# Patient Record
Sex: Male | Born: 1975 | Race: White | Hispanic: No | Marital: Married | Smoking: Former smoker
Health system: Southern US, Community
[De-identification: ages and names within clinical notes are randomized; demographics above are authoritative.]

## PROBLEM LIST (undated history)

## (undated) DIAGNOSIS — K219 Gastro-esophageal reflux disease without esophagitis: Secondary | ICD-10-CM

## (undated) DIAGNOSIS — B1920 Unspecified viral hepatitis C without hepatic coma: Secondary | ICD-10-CM

## (undated) HISTORY — PX: OTHER SURGICAL HISTORY: SHX169

## (undated) HISTORY — PX: TONSILLECTOMY AND ADENOIDECTOMY: SUR1326

## (undated) HISTORY — DX: Unspecified viral hepatitis C without hepatic coma: B19.20

## (undated) HISTORY — PX: HIP SURGERY: SHX245

## (undated) HISTORY — PX: HERNIA REPAIR: SHX51

## (undated) HISTORY — DX: Gastro-esophageal reflux disease without esophagitis: K21.9

---

## 2010-03-30 ENCOUNTER — Ambulatory Visit: Payer: Self-pay | Admitting: Internal Medicine

## 2014-07-12 ENCOUNTER — Ambulatory Visit (INDEPENDENT_AMBULATORY_CARE_PROVIDER_SITE_OTHER): Payer: Self-pay | Admitting: Internal Medicine

## 2014-10-18 ENCOUNTER — Encounter (INDEPENDENT_AMBULATORY_CARE_PROVIDER_SITE_OTHER): Payer: Self-pay | Admitting: *Deleted

## 2014-10-18 ENCOUNTER — Encounter (INDEPENDENT_AMBULATORY_CARE_PROVIDER_SITE_OTHER): Payer: Self-pay | Admitting: Internal Medicine

## 2014-10-18 ENCOUNTER — Other Ambulatory Visit (INDEPENDENT_AMBULATORY_CARE_PROVIDER_SITE_OTHER): Payer: Self-pay | Admitting: *Deleted

## 2014-10-18 ENCOUNTER — Ambulatory Visit (INDEPENDENT_AMBULATORY_CARE_PROVIDER_SITE_OTHER): Payer: Managed Care, Other (non HMO) | Admitting: Internal Medicine

## 2014-10-18 VITALS — BP 112/78 | HR 64 | Temp 97.8°F | Ht 72.0 in | Wt 234.7 lb

## 2014-10-18 DIAGNOSIS — K219 Gastro-esophageal reflux disease without esophagitis: Secondary | ICD-10-CM | POA: Insufficient documentation

## 2014-10-18 DIAGNOSIS — B1921 Unspecified viral hepatitis C with hepatic coma: Secondary | ICD-10-CM

## 2014-10-18 DIAGNOSIS — B1920 Unspecified viral hepatitis C without hepatic coma: Secondary | ICD-10-CM | POA: Insufficient documentation

## 2014-10-18 LAB — HEPATITIS C ANTIBODY: HCV Ab: REACTIVE — AB

## 2014-10-18 LAB — PROTIME-INR
INR: 1.08 (ref <1.50)
Prothrombin Time: 14 seconds (ref 11.6–15.2)

## 2014-10-18 LAB — CBC WITH DIFFERENTIAL/PLATELET
Basophils Absolute: 0.1 10*3/uL (ref 0.0–0.1)
Basophils Relative: 1 % (ref 0–1)
Eosinophils Absolute: 0.2 10*3/uL (ref 0.0–0.7)
Eosinophils Relative: 3 % (ref 0–5)
HEMATOCRIT: 46.9 % (ref 39.0–52.0)
Hemoglobin: 16.2 g/dL (ref 13.0–17.0)
LYMPHS PCT: 32 % (ref 12–46)
Lymphs Abs: 2 10*3/uL (ref 0.7–4.0)
MCH: 30.6 pg (ref 26.0–34.0)
MCHC: 34.5 g/dL (ref 30.0–36.0)
MCV: 88.5 fL (ref 78.0–100.0)
MPV: 10.8 fL (ref 8.6–12.4)
Monocytes Absolute: 0.6 10*3/uL (ref 0.1–1.0)
Monocytes Relative: 9 % (ref 3–12)
NEUTROS ABS: 3.5 10*3/uL (ref 1.7–7.7)
Neutrophils Relative %: 55 % (ref 43–77)
Platelets: 233 10*3/uL (ref 150–400)
RBC: 5.3 MIL/uL (ref 4.22–5.81)
RDW: 12.4 % (ref 11.5–15.5)
WBC: 6.3 10*3/uL (ref 4.0–10.5)

## 2014-10-18 LAB — COMPREHENSIVE METABOLIC PANEL
ALK PHOS: 47 U/L (ref 39–117)
ALT: 85 U/L — AB (ref 0–53)
AST: 48 U/L — ABNORMAL HIGH (ref 0–37)
Albumin: 4 g/dL (ref 3.5–5.2)
BUN: 15 mg/dL (ref 6–23)
CHLORIDE: 102 meq/L (ref 96–112)
CO2: 27 mEq/L (ref 19–32)
Calcium: 9.7 mg/dL (ref 8.4–10.5)
Creat: 0.97 mg/dL (ref 0.50–1.35)
GLUCOSE: 98 mg/dL (ref 70–99)
POTASSIUM: 4.7 meq/L (ref 3.5–5.3)
SODIUM: 137 meq/L (ref 135–145)
TOTAL PROTEIN: 7.2 g/dL (ref 6.0–8.3)
Total Bilirubin: 0.6 mg/dL (ref 0.2–1.2)

## 2014-10-18 LAB — TSH: TSH: 2.417 u[IU]/mL (ref 0.350–4.500)

## 2014-10-18 MED ORDER — PANTOPRAZOLE SODIUM 40 MG PO TBEC: 40.0000 mg | DELAYED_RELEASE_TABLET | Freq: Two times a day (BID) | ORAL | Status: DC
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
## 2014-10-18 NOTE — Patient Instructions (Signed)
EGD. The risks and benefits such as perforation, bleeding, and infection were reviewed with the patient and is agreeable. 

## 2014-10-19 LAB — DRUG SCREEN, URINE
Amphetamine Screen, Ur: NEGATIVE
Barbiturate Quant, Ur: NEGATIVE
Benzodiazepines.: NEGATIVE
COCAINE METABOLITES: NEGATIVE
CREATININE, U: 66.41 mg/dL
MARIJUANA METABOLITE: NEGATIVE
Methadone: NEGATIVE
OPIATES: NEGATIVE
Phencyclidine (PCP): NEGATIVE
Propoxyphene: NEGATIVE

## 2014-10-19 LAB — AFP TUMOR MARKER: AFP-Tumor Marker: 2.8 ng/mL (ref <6.1)

## 2014-10-21 LAB — HEPATITIS C RNA QUANTITATIVE
HCV QUANT LOG: 6.16 {Log} — AB (ref <1.18)
HCV QUANT: 1447898 [IU]/mL — AB (ref <15)

## 2014-10-22 ENCOUNTER — Telehealth (INDEPENDENT_AMBULATORY_CARE_PROVIDER_SITE_OTHER): Payer: Self-pay | Admitting: Internal Medicine

## 2014-10-22 DIAGNOSIS — B1921 Unspecified viral hepatitis C with hepatic coma: Secondary | ICD-10-CM

## 2014-10-22 DIAGNOSIS — B1920 Unspecified viral hepatitis C without hepatic coma: Secondary | ICD-10-CM

## 2014-10-22 LAB — HEPATITIS C GENOTYPE: HCV GENOTYPE: 3

## 2014-10-22 MED ORDER — PANTOPRAZOLE SODIUM 40 MG PO TBEC: 40.0000 mg | DELAYED_RELEASE_TABLET | Freq: Two times a day (BID) | ORAL | Status: DC

## 2014-10-22 NOTE — Telephone Encounter (Signed)
US abdomen/elastrography ordered

## 2014-10-22 NOTE — Telephone Encounter (Signed)
Spoke with patient.

## 2014-10-22 NOTE — Telephone Encounter (Signed)
error 

## 2014-10-30 ENCOUNTER — Ambulatory Visit (HOSPITAL_COMMUNITY)
Admission: RE | Admit: 2014-10-30 | Discharge: 2014-10-30 | Disposition: A | Discharge status: Home / Self Care | Payer: 59 | Source: Ambulatory Visit | Attending: Internal Medicine | Admitting: Internal Medicine

## 2014-10-30 DIAGNOSIS — B1920 Unspecified viral hepatitis C without hepatic coma: Secondary | ICD-10-CM | POA: Insufficient documentation

## 2014-10-31 ENCOUNTER — Telehealth (INDEPENDENT_AMBULATORY_CARE_PROVIDER_SITE_OTHER): Payer: Self-pay | Admitting: Internal Medicine

## 2014-10-31 NOTE — Telephone Encounter (Signed)
Tammy, will treat with Sovaldi and Ribavirin 1200mg  x 24 weeks

## 2014-11-01 NOTE — Telephone Encounter (Signed)
A request will be sent to [facility].

## 2014-11-13 ENCOUNTER — Encounter (HOSPITAL_COMMUNITY)
Admission: RE | Disposition: A | Discharge status: Home / Self Care | Payer: Self-pay | Source: Ambulatory Visit | Attending: Internal Medicine

## 2014-11-13 ENCOUNTER — Ambulatory Visit (HOSPITAL_COMMUNITY)
Admission: RE | Admit: 2014-11-13 | Discharge: 2014-11-13 | Disposition: A | Discharge status: Home / Self Care | Payer: Managed Care, Other (non HMO) | Source: Ambulatory Visit | Attending: Internal Medicine | Admitting: Internal Medicine

## 2014-11-13 ENCOUNTER — Encounter (HOSPITAL_COMMUNITY): Payer: Self-pay | Admitting: *Deleted

## 2014-11-13 DIAGNOSIS — K3189 Other diseases of stomach and duodenum: Secondary | ICD-10-CM | POA: Diagnosis not present

## 2014-11-13 DIAGNOSIS — R1319 Other dysphagia: Secondary | ICD-10-CM | POA: Diagnosis present

## 2014-11-13 DIAGNOSIS — K2270 Barrett's esophagus without dysplasia: Secondary | ICD-10-CM | POA: Diagnosis not present

## 2014-11-13 DIAGNOSIS — Z87891 Personal history of nicotine dependence: Secondary | ICD-10-CM | POA: Insufficient documentation

## 2014-11-13 DIAGNOSIS — Z800 Family history of malignant neoplasm of digestive organs: Secondary | ICD-10-CM | POA: Diagnosis not present

## 2014-11-13 DIAGNOSIS — R1310 Dysphagia, unspecified: Secondary | ICD-10-CM | POA: Diagnosis not present

## 2014-11-13 DIAGNOSIS — B1920 Unspecified viral hepatitis C without hepatic coma: Secondary | ICD-10-CM | POA: Diagnosis not present

## 2014-11-13 DIAGNOSIS — K449 Diaphragmatic hernia without obstruction or gangrene: Secondary | ICD-10-CM

## 2014-11-13 DIAGNOSIS — K222 Esophageal obstruction: Secondary | ICD-10-CM | POA: Insufficient documentation

## 2014-11-13 DIAGNOSIS — K2211 Ulcer of esophagus with bleeding: Secondary | ICD-10-CM | POA: Diagnosis not present

## 2014-11-13 DIAGNOSIS — Z79899 Other long term (current) drug therapy: Secondary | ICD-10-CM | POA: Insufficient documentation

## 2014-11-13 DIAGNOSIS — K219 Gastro-esophageal reflux disease without esophagitis: Secondary | ICD-10-CM

## 2014-11-13 HISTORY — PX: ESOPHAGOGASTRODUODENOSCOPY: SHX5428

## 2014-11-13 SURGERY — EGD (ESOPHAGOGASTRODUODENOSCOPY)
Anesthesia: Moderate Sedation

## 2014-11-13 MED ORDER — STERILE WATER FOR IRRIGATION IR SOLN
Status: DC | PRN
  Administered 2014-11-13: 12:00:00

## 2014-11-13 MED ORDER — MIDAZOLAM HCL 5 MG/5ML IJ SOLN
INTRAMUSCULAR | Status: AC
  Filled 2014-11-13: qty 10

## 2014-11-13 MED ORDER — MEPERIDINE HCL 50 MG/ML IJ SOLN
INTRAMUSCULAR | Status: DC | PRN
  Administered 2014-11-13 (×2): 25 mg

## 2014-11-13 MED ORDER — MEPERIDINE HCL 50 MG/ML IJ SOLN
INTRAMUSCULAR | Status: AC
  Filled 2014-11-13: qty 1

## 2014-11-13 MED ORDER — MIDAZOLAM HCL 5 MG/5ML IJ SOLN
INTRAMUSCULAR | Status: AC
  Filled 2014-11-13: qty 5

## 2014-11-13 MED ORDER — BUTAMBEN-TETRACAINE-BENZOCAINE 2-2-14 % EX AERO
INHALATION_SPRAY | CUTANEOUS | Status: DC | PRN
  Administered 2014-11-13: 2 via TOPICAL

## 2014-11-13 MED ORDER — SODIUM CHLORIDE 0.9 % IV SOLN
INTRAVENOUS | Status: DC
  Administered 2014-11-13: 12:00:00 via INTRAVENOUS

## 2014-11-13 MED ORDER — MIDAZOLAM HCL 5 MG/5ML IJ SOLN
INTRAMUSCULAR | Status: DC | PRN
  Administered 2014-11-13: 3 mg via INTRAVENOUS
  Administered 2014-11-13: 2 mg via INTRAVENOUS
  Administered 2014-11-13: 1 mg via INTRAVENOUS
  Administered 2014-11-13 (×3): 2 mg via INTRAVENOUS
  Administered 2014-11-13: 3 mg via INTRAVENOUS

## 2014-11-13 NOTE — Op Note (Signed)
EGD PROCEDURE REPORT  PATIENT:  David LoganRonald D Anthony  MR#:  [id number] Birthdate:  [birth year], 39 y.o., male Endoscopist:  Dr. Malissa HippoNajeeb U. Arielle Eber, MD  Procedure Date: 11/13/2014  Procedure:   EGD with ED  Indications:  Patient is 39 year old Caucasian male with 20 of history of heartburn who is presently in pantoprazole 40 mg twice a day and doing well. She also complains of intermittent solid food dysphagia. He is undergoing diagnostic/therapeutic EGD.            Informed Consent:  The risks, benefits, alternatives & imponderables which include, but are not limited to, bleeding, infection, perforation, drug reaction and potential missed lesion have been reviewed.  The potential for biopsy, lesion removal, esophageal dilation, etc. have also been discussed.  Questions have been answered.  All parties agreeable.  Please see history & physical in medical record for more information.  Medications:  Demerol 50 mg IV Versed 15 mg IV Cetacaine spray topically for oropharyngeal anesthesia  Description of procedure:  The endoscope was introduced through the mouth and advanced to the second portion of the duodenum without difficulty or limitations. The mucosal surfaces were surveyed very carefully during advancement of the scope and upon withdrawal.  Findings:  Esophagus:  Mucosa of the esophagus was normal. Serrated or baby GE junction with noncritical ring. 4 mm island of salmon colored mucosa was noted above the GE junction. GEJ:  39 cm Hiatus:  39 cm Stomach:  Stomach was empty and distended very well with insufflation. Folds in the proximal stomach were normal. Examination mucosa and body, antrum, pyloric channel, and restless fundus and cardia was normal. Duodenum:  Normal bulbar and post bulbar mucosa.  Therapeutic/Diagnostic Maneuvers Performed:   Esophagus dilated by passing 56 [place] dilator. Dilator was only passed to 65 cm. Ring was still intact and therefore disrupted with focal biopsy at  two sites. Biopsy was also taken from mucosa to GE junction to rule out short segment Barrett's esophagus.  Complications:  None  Impression: Wavy or serrated GE junction with small sliding hiatal hernia. Noncritical Schatzki's ring disrupted with focal biopsy as it was still intact following passage of 56 [place] dilator. Biopsy taken from GE junction to rule out short segment Barrett's.   Recommendations:  Continue anti-reflux measures. Continue pantoprazole at 40 mg by mouth twice a day. I will be contacting patient with biopsy results.  Ambry Dix U  11/13/2014  12:48 PM  CC: Dr. Bonnetta BarryNo PCP Per Patient & Dr. No ref. provider found
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
## 2014-11-13 NOTE — Discharge Instructions (Signed)
Resume pantoprazole as before. Anti-reflux measures. No driving for 24 hours. Physician will call with biopsy results.     Esophagogastroduodenoscopy Care After Refer to this sheet in the next few weeks. These instructions provide you with information on caring for yourself after your procedure. Your caregiver may also give you more specific instructions. Your treatment has been planned according to current medical practices, but problems sometimes occur. Call your caregiver if you have any problems or questions after your procedure.  HOME CARE INSTRUCTIONS  Do not eat or drink anything until the numbing medicine (local anesthetic) has worn off and your gag reflex has returned. You will know that the local anesthetic has worn off when you can swallow comfortably.  Do not drive for 12 hours after the procedure or as directed by your caregiver.  Only take medicines as directed by your caregiver. SEEK MEDICAL CARE IF:   You cannot stop coughing.  You are not urinating at all or less than usual. SEEK IMMEDIATE MEDICAL CARE IF:  You have difficulty swallowing.  You cannot eat or drink.  You have worsening throat or chest pain.  You have dizziness, lightheadedness, or you faint.  You have nausea or vomiting.  You have chills.  You have a fever.  You have severe abdominal pain.  You have black, tarry, or bloody stools. Document Released: 08/09/2012 Document Reviewed: 08/09/2012 [facility] Patient Information 2015 [place], [place]. This information is not intended to replace advice given to you by your health care provider. Make sure you discuss any questions you have with your health care provider.  Gastroesophageal Reflux Disease, Adult Gastroesophageal reflux disease (GERD) happens when acid from your stomach flows up into the esophagus. When acid comes in contact with the esophagus, the acid causes soreness (inflammation) in the esophagus. Over time, GERD may create small holes  (ulcers) in the lining of the esophagus. CAUSES   Increased body weight. This puts pressure on the stomach, making acid rise from the stomach into the esophagus.  Smoking. This increases acid production in the stomach.  Drinking alcohol. This causes decreased pressure in the lower esophageal sphincter (valve or ring of muscle between the esophagus and stomach), allowing acid from the stomach into the esophagus.  Late evening meals and a full stomach. This increases pressure and acid production in the stomach.  A malformed lower esophageal sphincter. Sometimes, no cause is found. SYMPTOMS   Burning pain in the lower part of the mid-chest behind the breastbone and in the mid-stomach area. This may occur twice a week or more often.  Trouble swallowing.  Sore throat.  Dry cough.  Asthma-like symptoms including chest tightness, shortness of breath, or wheezing. DIAGNOSIS  Your caregiver may be able to diagnose GERD based on your symptoms. In some cases, X-rays and other tests may be done to check for complications or to check the condition of your stomach and esophagus. TREATMENT  Your caregiver may recommend over-the-counter or prescription medicines to help decrease acid production. Ask your caregiver before starting or adding any new medicines.  HOME CARE INSTRUCTIONS   Change the factors that you can control. Ask your caregiver for guidance concerning weight loss, quitting smoking, and alcohol consumption.  Avoid foods and drinks that make your symptoms worse, such as:  Caffeine or alcoholic drinks.  Chocolate.  Peppermint or mint flavorings.  Garlic and onions.  Spicy foods.  Citrus fruits, such as oranges, lemons, or limes.  Tomato-based foods such as sauce, chili, salsa, and pizza.  [place]  and fatty foods.  Avoid lying down for the 3 hours prior to your bedtime or prior to taking a nap.  Eat small, frequent meals instead of large meals.  Wear loose-fitting  clothing. Do not wear anything tight around your waist that causes pressure on your stomach.  Raise the head of your bed 6 to 8 inches with wood blocks to help you sleep. Extra pillows will not help.  Only take over-the-counter or prescription medicines for pain, discomfort, or fever as directed by your caregiver.  Do not take aspirin, ibuprofen, or other nonsteroidal anti-inflammatory drugs (NSAIDs). SEEK IMMEDIATE MEDICAL CARE IF:   You have pain in your arms, neck, jaw, teeth, or back.  Your pain increases or changes in intensity or duration.  You develop nausea, vomiting, or sweating (diaphoresis).  You develop shortness of breath, or you faint.  Your vomit is green, yellow, black, or looks like coffee grounds or blood.  Your stool is red, bloody, or black. These symptoms could be signs of other problems, such as heart disease, gastric bleeding, or esophageal bleeding. MAKE SURE YOU:   Understand these instructions.  Will watch your condition.  Will get help right away if you are not doing well or get worse. Document Released: 06/02/2005 Document Revised: 11/15/2011 Document Reviewed: 03/12/2011 [facility] Patient Information 2015 [place], [place]. This information is not intended to replace advice given to you by your health care provider. Make sure you discuss any questions you have with your health care provider.

## 2014-11-15 ENCOUNTER — Encounter (HOSPITAL_COMMUNITY): Payer: Self-pay | Admitting: Internal Medicine

## 2014-11-19 ENCOUNTER — Encounter (INDEPENDENT_AMBULATORY_CARE_PROVIDER_SITE_OTHER): Payer: Self-pay | Admitting: Internal Medicine

## 2015-01-02 ENCOUNTER — Telehealth (INDEPENDENT_AMBULATORY_CARE_PROVIDER_SITE_OTHER): Payer: Self-pay | Admitting: *Deleted

## 2015-01-02 NOTE — Telephone Encounter (Signed)
I talked with the patient had he states that he is to ger his medication tomorrow,01/03/15 or Saturday ,01/04/15. He is going to call our office to make us aware of his start date , so that we may arrange his labs and office visits.

## 2015-01-06 ENCOUNTER — Encounter (INDEPENDENT_AMBULATORY_CARE_PROVIDER_SITE_OTHER): Payer: Self-pay | Admitting: *Deleted

## 2015-01-06 ENCOUNTER — Telehealth (INDEPENDENT_AMBULATORY_CARE_PROVIDER_SITE_OTHER): Payer: Self-pay | Admitting: *Deleted

## 2015-01-06 DIAGNOSIS — B182 Chronic viral hepatitis C: Secondary | ICD-10-CM

## 2015-01-06 NOTE — Telephone Encounter (Signed)
Labs have been noted and a letter has been sent to patient as a reminder.

## 2015-01-06 NOTE — Telephone Encounter (Signed)
David Anthony started his Hep C treatment on 01/03/15. A 4 week f/u has been scheduled on 01/31/15 with David Arerri Setzer, NP. He was also advised to have labs the day before his apt.

## 2015-01-06 NOTE — Telephone Encounter (Signed)
Needs Hepatic function, CBC with diff in 2 weeks.

## 2015-01-06 NOTE — Telephone Encounter (Signed)
Labs have been noted and a letter has been sent to patient as a reminder. 

## 2015-01-06 NOTE — Telephone Encounter (Signed)
Labs are noted for 2 week sand 4 weeks.

## 2015-01-20 LAB — CBC WITH DIFFERENTIAL/PLATELET
Basophils Absolute: 0 10*3/uL (ref 0.0–0.1)
Basophils Relative: 0 % (ref 0–1)
EOS ABS: 0.2 10*3/uL (ref 0.0–0.7)
Eosinophils Relative: 3 % (ref 0–5)
HCT: 39.4 % (ref 39.0–52.0)
Hemoglobin: 13.6 g/dL (ref 13.0–17.0)
LYMPHS PCT: 28 % (ref 12–46)
Lymphs Abs: 2.2 10*3/uL (ref 0.7–4.0)
MCH: 30.6 pg (ref 26.0–34.0)
MCHC: 34.5 g/dL (ref 30.0–36.0)
MCV: 88.5 fL (ref 78.0–100.0)
MONOS PCT: 8 % (ref 3–12)
MPV: 10.3 fL (ref 8.6–12.4)
Monocytes Absolute: 0.6 10*3/uL (ref 0.1–1.0)
NEUTROS ABS: 4.7 10*3/uL (ref 1.7–7.7)
NEUTROS PCT: 61 % (ref 43–77)
Platelets: 279 10*3/uL (ref 150–400)
RBC: 4.45 MIL/uL (ref 4.22–5.81)
RDW: 12.7 % (ref 11.5–15.5)
WBC: 7.7 10*3/uL (ref 4.0–10.5)

## 2015-01-20 LAB — HEPATIC FUNCTION PANEL
ALK PHOS: 47 U/L (ref 39–117)
ALT: 19 U/L (ref 0–53)
AST: 19 U/L (ref 0–37)
Albumin: 3.9 g/dL (ref 3.5–5.2)
Bilirubin, Direct: 0.4 mg/dL — ABNORMAL HIGH (ref 0.0–0.3)
Indirect Bilirubin: 1 mg/dL (ref 0.2–1.2)
TOTAL PROTEIN: 6.8 g/dL (ref 6.0–8.3)
Total Bilirubin: 1.4 mg/dL — ABNORMAL HIGH (ref 0.2–1.2)

## 2015-01-21 LAB — HEPATITIS C RNA QUANTITATIVE: HCV Quantitative Log: <1.18 {Log} (ref <1.18)

## 2015-01-22 ENCOUNTER — Telehealth (INDEPENDENT_AMBULATORY_CARE_PROVIDER_SITE_OTHER): Payer: Self-pay | Admitting: *Deleted

## 2015-01-22 NOTE — Telephone Encounter (Signed)
David Anthony is returning [name]'s call. His phone number is [phone].

## 2015-01-22 NOTE — Telephone Encounter (Signed)
Results given to patient

## 2015-01-24 ENCOUNTER — Telehealth (INDEPENDENT_AMBULATORY_CARE_PROVIDER_SITE_OTHER): Payer: Self-pay | Admitting: Internal Medicine

## 2015-01-24 DIAGNOSIS — J328 Other chronic sinusitis: Secondary | ICD-10-CM

## 2015-01-24 MED ORDER — LEVOFLOXACIN 250 MG PO TABS: 250.0000 mg | ORAL_TABLET | Freq: Two times a day (BID) | ORAL | Status: DC

## 2015-01-24 NOTE — Telephone Encounter (Signed)
Rx for Levaquin 250mg  BID sent to pharmacy

## 2015-01-31 ENCOUNTER — Ambulatory Visit (INDEPENDENT_AMBULATORY_CARE_PROVIDER_SITE_OTHER): Payer: Managed Care, Other (non HMO) | Admitting: Internal Medicine

## 2015-01-31 ENCOUNTER — Telehealth (INDEPENDENT_AMBULATORY_CARE_PROVIDER_SITE_OTHER): Payer: Self-pay | Admitting: *Deleted

## 2015-01-31 ENCOUNTER — Encounter (INDEPENDENT_AMBULATORY_CARE_PROVIDER_SITE_OTHER): Payer: Self-pay | Admitting: Internal Medicine

## 2015-01-31 ENCOUNTER — Encounter (INDEPENDENT_AMBULATORY_CARE_PROVIDER_SITE_OTHER): Payer: Self-pay | Admitting: *Deleted

## 2015-01-31 VITALS — BP 100/70 | HR 80 | Temp 97.0°F | Ht 72.0 in | Wt 227.4 lb

## 2015-01-31 DIAGNOSIS — B1921 Unspecified viral hepatitis C with hepatic coma: Secondary | ICD-10-CM | POA: Diagnosis not present

## 2015-01-31 DIAGNOSIS — B182 Chronic viral hepatitis C: Secondary | ICD-10-CM

## 2015-01-31 NOTE — Patient Instructions (Addendum)
OV in 8 weeks . Hep C quaint, CBC with diff , Hepatic function in 4 weeks.

## 2015-01-31 NOTE — Telephone Encounter (Signed)
.  Per [name] Setzer,NP patient to have lab work in 4 weeks. 
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
## 2015-02-01 LAB — HEPATIC FUNCTION PANEL
ALK PHOS: 43 U/L (ref 39–117)
ALT: 12 U/L (ref 0–53)
AST: 15 U/L (ref 0–37)
Albumin: 3.9 g/dL (ref 3.5–5.2)
BILIRUBIN INDIRECT: 1.4 mg/dL — AB (ref 0.2–1.2)
BILIRUBIN TOTAL: 1.8 mg/dL — AB (ref 0.2–1.2)
Bilirubin, Direct: 0.4 mg/dL — ABNORMAL HIGH (ref 0.0–0.3)
Total Protein: 6.5 g/dL (ref 6.0–8.3)

## 2015-02-05 LAB — HEPATITIS C RNA QUANTITATIVE: HCV Quantitative: NOT DETECTED IU/mL (ref <15)

## 2015-03-01 LAB — CBC WITH DIFFERENTIAL/PLATELET
BASOS ABS: 0.1 10*3/uL (ref 0.0–0.1)
Basophils Relative: 1 % (ref 0–1)
EOS PCT: 3 % (ref 0–5)
Eosinophils Absolute: 0.2 10*3/uL (ref 0.0–0.7)
HCT: 41.4 % (ref 39.0–52.0)
HEMOGLOBIN: 13.3 g/dL (ref 13.0–17.0)
LYMPHS ABS: 2.1 10*3/uL (ref 0.7–4.0)
Lymphocytes Relative: 27 % (ref 12–46)
MCH: 29.1 pg (ref 26.0–34.0)
MCHC: 32.1 g/dL (ref 30.0–36.0)
MCV: 90.6 fL (ref 78.0–100.0)
MONO ABS: 0.4 10*3/uL (ref 0.1–1.0)
MONOS PCT: 5 % (ref 3–12)
MPV: 10.5 fL (ref 8.6–12.4)
Neutro Abs: 4.9 10*3/uL (ref 1.7–7.7)
Neutrophils Relative %: 64 % (ref 43–77)
Platelets: 268 10*3/uL (ref 150–400)
RBC: 4.57 MIL/uL (ref 4.22–5.81)
RDW: 12.8 % (ref 11.5–15.5)
WBC: 7.6 10*3/uL (ref 4.0–10.5)

## 2015-03-01 LAB — HEPATIC FUNCTION PANEL
ALK PHOS: 53 U/L (ref 39–117)
ALT: 10 U/L (ref 0–53)
AST: 11 U/L (ref 0–37)
Albumin: 4 g/dL (ref 3.5–5.2)
Bilirubin, Direct: 0.2 mg/dL (ref 0.0–0.3)
Indirect Bilirubin: 0.7 mg/dL (ref 0.2–1.2)
Total Bilirubin: 0.9 mg/dL (ref 0.2–1.2)
Total Protein: 6.8 g/dL (ref 6.0–8.3)

## 2015-03-04 LAB — HEPATITIS C RNA QUANTITATIVE: HCV QUANT: NOT DETECTED [IU]/mL (ref <15)

## 2015-03-24 ENCOUNTER — Other Ambulatory Visit (INDEPENDENT_AMBULATORY_CARE_PROVIDER_SITE_OTHER): Payer: Self-pay | Admitting: Internal Medicine

## 2015-03-26 ENCOUNTER — Ambulatory Visit (INDEPENDENT_AMBULATORY_CARE_PROVIDER_SITE_OTHER): Payer: Managed Care, Other (non HMO) | Admitting: Internal Medicine

## 2015-03-26 ENCOUNTER — Encounter (INDEPENDENT_AMBULATORY_CARE_PROVIDER_SITE_OTHER): Payer: Self-pay | Admitting: Internal Medicine

## 2015-03-26 VITALS — BP 126/70 | HR 64 | Temp 97.5°F | Ht 72.0 in | Wt 230.4 lb

## 2015-03-26 DIAGNOSIS — B1920 Unspecified viral hepatitis C without hepatic coma: Secondary | ICD-10-CM | POA: Diagnosis not present
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
## 2015-03-26 NOTE — Patient Instructions (Addendum)
OV in 6 weeks. Labs in 4 weeks and today.

## 2015-03-27 ENCOUNTER — Telehealth (INDEPENDENT_AMBULATORY_CARE_PROVIDER_SITE_OTHER): Payer: Self-pay | Admitting: *Deleted

## 2015-03-27 DIAGNOSIS — B182 Chronic viral hepatitis C: Secondary | ICD-10-CM

## 2015-03-27 LAB — HEPATIC FUNCTION PANEL
ALK PHOS: 49 U/L (ref 39–117)
ALT: 12 U/L (ref 0–53)
AST: 15 U/L (ref 0–37)
Albumin: 4.1 g/dL (ref 3.5–5.2)
BILIRUBIN DIRECT: 0.2 mg/dL (ref 0.0–0.3)
BILIRUBIN TOTAL: 1 mg/dL (ref 0.2–1.2)
Indirect Bilirubin: 0.8 mg/dL (ref 0.2–1.2)
TOTAL PROTEIN: 6.8 g/dL (ref 6.0–8.3)

## 2015-03-27 LAB — CBC WITH DIFFERENTIAL/PLATELET
BASOS ABS: 0.1 10*3/uL (ref 0.0–0.1)
Basophils Relative: 1 % (ref 0–1)
EOS PCT: 2 % (ref 0–5)
Eosinophils Absolute: 0.1 10*3/uL (ref 0.0–0.7)
HCT: 39.1 % (ref 39.0–52.0)
Hemoglobin: 12.4 g/dL — ABNORMAL LOW (ref 13.0–17.0)
LYMPHS ABS: 1.6 10*3/uL (ref 0.7–4.0)
LYMPHS PCT: 25 % (ref 12–46)
MCH: 28.7 pg (ref 26.0–34.0)
MCHC: 31.7 g/dL (ref 30.0–36.0)
MCV: 90.5 fL (ref 78.0–100.0)
MONO ABS: 0.5 10*3/uL (ref 0.1–1.0)
MONOS PCT: 7 % (ref 3–12)
MPV: 10.3 fL (ref 8.6–12.4)
NEUTROS ABS: 4.2 10*3/uL (ref 1.7–7.7)
NEUTROS PCT: 65 % (ref 43–77)
Platelets: 272 10*3/uL (ref 150–400)
RBC: 4.32 MIL/uL (ref 4.22–5.81)
RDW: 12.8 % (ref 11.5–15.5)
WBC: 6.5 10*3/uL (ref 4.0–10.5)

## 2015-03-27 NOTE — Telephone Encounter (Signed)
.  Per [name] Setzer,NP patient to have lab work in 4 weeks. 

## 2015-03-28 ENCOUNTER — Ambulatory Visit (INDEPENDENT_AMBULATORY_CARE_PROVIDER_SITE_OTHER): Payer: Managed Care, Other (non HMO) | Admitting: Internal Medicine

## 2015-03-28 LAB — HEPATITIS C RNA QUANTITATIVE: HCV Quantitative: NOT DETECTED IU/mL (ref <15)

## 2015-04-02 ENCOUNTER — Other Ambulatory Visit (INDEPENDENT_AMBULATORY_CARE_PROVIDER_SITE_OTHER): Payer: Self-pay | Admitting: *Deleted

## 2015-04-02 ENCOUNTER — Encounter (INDEPENDENT_AMBULATORY_CARE_PROVIDER_SITE_OTHER): Payer: Self-pay | Admitting: *Deleted

## 2015-04-02 DIAGNOSIS — B182 Chronic viral hepatitis C: Secondary | ICD-10-CM

## 2015-05-06 LAB — HEPATIC FUNCTION PANEL
ALBUMIN: 4.2 g/dL (ref 3.6–5.1)
ALT: 12 U/L (ref 9–46)
AST: 15 U/L (ref 10–40)
Alkaline Phosphatase: 46 U/L (ref 40–115)
BILIRUBIN TOTAL: 1.2 mg/dL (ref 0.2–1.2)
Bilirubin, Direct: 0.3 mg/dL — ABNORMAL HIGH (ref <=0.2)
Indirect Bilirubin: 0.9 mg/dL (ref 0.2–1.2)
Total Protein: 6.6 g/dL (ref 6.1–8.1)

## 2015-05-06 LAB — CBC WITH DIFFERENTIAL/PLATELET
BASOS PCT: 1 % (ref 0–1)
Basophils Absolute: 0.1 10*3/uL (ref 0.0–0.1)
EOS ABS: 0.2 10*3/uL (ref 0.0–0.7)
Eosinophils Relative: 3 % (ref 0–5)
HCT: 38.6 % — ABNORMAL LOW (ref 39.0–52.0)
HEMOGLOBIN: 12.7 g/dL — AB (ref 13.0–17.0)
LYMPHS ABS: 1.3 10*3/uL (ref 0.7–4.0)
Lymphocytes Relative: 21 % (ref 12–46)
MCH: 29.3 pg (ref 26.0–34.0)
MCHC: 32.9 g/dL (ref 30.0–36.0)
MCV: 88.9 fL (ref 78.0–100.0)
MONO ABS: 0.5 10*3/uL (ref 0.1–1.0)
MPV: 9.9 fL (ref 8.6–12.4)
Monocytes Relative: 8 % (ref 3–12)
NEUTROS PCT: 67 % (ref 43–77)
Neutro Abs: 4.1 10*3/uL (ref 1.7–7.7)
Platelets: 246 10*3/uL (ref 150–400)
RBC: 4.34 MIL/uL (ref 4.22–5.81)
RDW: 12.7 % (ref 11.5–15.5)
WBC: 6.1 10*3/uL (ref 4.0–10.5)

## 2015-05-06 LAB — HEPATITIS C RNA QUANTITATIVE: HCV QUANT: NOT DETECTED [IU]/mL (ref <15)

## 2015-05-08 ENCOUNTER — Ambulatory Visit (INDEPENDENT_AMBULATORY_CARE_PROVIDER_SITE_OTHER): Payer: BLUE CROSS/BLUE SHIELD | Admitting: Internal Medicine

## 2015-05-08 ENCOUNTER — Encounter (INDEPENDENT_AMBULATORY_CARE_PROVIDER_SITE_OTHER): Payer: Self-pay | Admitting: Internal Medicine

## 2015-05-08 VITALS — BP 102/65 | HR 72 | Temp 98.0°F | Ht 72.0 in | Wt 225.5 lb

## 2015-05-08 DIAGNOSIS — B1920 Unspecified viral hepatitis C without hepatic coma: Secondary | ICD-10-CM

## 2015-05-08 MED ORDER — PANTOPRAZOLE SODIUM 40 MG PO TBEC: DELAYED_RELEASE_TABLET | ORAL | Status: DC
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
## 2015-05-08 NOTE — Patient Instructions (Signed)
OV in 8 weeks.  

## 2015-05-13 ENCOUNTER — Telehealth (INDEPENDENT_AMBULATORY_CARE_PROVIDER_SITE_OTHER): Payer: Self-pay | Admitting: *Deleted

## 2015-05-13 DIAGNOSIS — B182 Chronic viral hepatitis C: Secondary | ICD-10-CM

## 2015-05-13 NOTE — Telephone Encounter (Signed)
.  Per Delrae Rend- Patient is to have labs in 8 weeks.

## 2015-05-29 ENCOUNTER — Other Ambulatory Visit (INDEPENDENT_AMBULATORY_CARE_PROVIDER_SITE_OTHER): Payer: Self-pay | Admitting: Internal Medicine

## 2015-06-30 ENCOUNTER — Telehealth (INDEPENDENT_AMBULATORY_CARE_PROVIDER_SITE_OTHER): Payer: Self-pay | Admitting: *Deleted

## 2015-06-30 DIAGNOSIS — B182 Chronic viral hepatitis C: Secondary | ICD-10-CM

## 2015-06-30 LAB — CBC WITH DIFFERENTIAL/PLATELET
BASOS ABS: 0.1 10*3/uL (ref 0.0–0.1)
BASOS PCT: 1 % (ref 0–1)
Eosinophils Absolute: 0.2 10*3/uL (ref 0.0–0.7)
Eosinophils Relative: 3 % (ref 0–5)
HCT: 40.6 % (ref 39.0–52.0)
HEMOGLOBIN: 13.5 g/dL (ref 13.0–17.0)
Lymphocytes Relative: 24 % (ref 12–46)
Lymphs Abs: 1.6 10*3/uL (ref 0.7–4.0)
MCH: 29 pg (ref 26.0–34.0)
MCHC: 33.3 g/dL (ref 30.0–36.0)
MCV: 87.3 fL (ref 78.0–100.0)
MONOS PCT: 5 % (ref 3–12)
MPV: 10 fL (ref 8.6–12.4)
Monocytes Absolute: 0.3 10*3/uL (ref 0.1–1.0)
NEUTROS ABS: 4.4 10*3/uL (ref 1.7–7.7)
NEUTROS PCT: 67 % (ref 43–77)
PLATELETS: 271 10*3/uL (ref 150–400)
RBC: 4.65 MIL/uL (ref 4.22–5.81)
RDW: 13 % (ref 11.5–15.5)
WBC: 6.5 10*3/uL (ref 4.0–10.5)

## 2015-06-30 LAB — HEPATIC FUNCTION PANEL
ALBUMIN: 4.3 g/dL (ref 3.6–5.1)
ALT: 14 U/L (ref 9–46)
AST: 13 U/L (ref 10–40)
Alkaline Phosphatase: 60 U/L (ref 40–115)
BILIRUBIN TOTAL: 0.9 mg/dL (ref 0.2–1.2)
Bilirubin, Direct: 0.2 mg/dL (ref <=0.2)
Indirect Bilirubin: 0.7 mg/dL (ref 0.2–1.2)
TOTAL PROTEIN: 7 g/dL (ref 6.1–8.1)

## 2015-06-30 NOTE — Telephone Encounter (Signed)
.  Per [name] Setzer,NP 

## 2015-07-01 LAB — HEPATITIS C RNA QUANTITATIVE: HCV QUANT: NOT DETECTED [IU]/mL (ref <15)

## 2015-07-03 ENCOUNTER — Encounter (INDEPENDENT_AMBULATORY_CARE_PROVIDER_SITE_OTHER): Payer: Self-pay | Admitting: *Deleted

## 2015-07-03 ENCOUNTER — Encounter (INDEPENDENT_AMBULATORY_CARE_PROVIDER_SITE_OTHER): Payer: Self-pay | Admitting: Internal Medicine

## 2015-07-03 ENCOUNTER — Ambulatory Visit (INDEPENDENT_AMBULATORY_CARE_PROVIDER_SITE_OTHER): Payer: BLUE CROSS/BLUE SHIELD | Admitting: Internal Medicine

## 2015-07-03 ENCOUNTER — Other Ambulatory Visit (INDEPENDENT_AMBULATORY_CARE_PROVIDER_SITE_OTHER): Payer: Self-pay | Admitting: *Deleted

## 2015-07-03 VITALS — BP 118/70 | HR 72 | Temp 98.0°F | Ht 72.0 in | Wt 228.4 lb

## 2015-07-03 DIAGNOSIS — B182 Chronic viral hepatitis C: Secondary | ICD-10-CM

## 2015-07-03 DIAGNOSIS — B1710 Acute hepatitis C without hepatic coma: Secondary | ICD-10-CM
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
## 2015-07-03 NOTE — Patient Instructions (Signed)
He has cleared virus. OV in  1 year.

## 2016-09-08 ENCOUNTER — Other Ambulatory Visit (INDEPENDENT_AMBULATORY_CARE_PROVIDER_SITE_OTHER): Payer: Self-pay | Admitting: Internal Medicine

## 2016-09-08 NOTE — Telephone Encounter (Signed)
Patient will need to have an appointment, as he has not been seen in the OV since 2016 , prior to further refills. Or the patient may get from his PCP.

## 2016-09-15 ENCOUNTER — Telehealth (INDEPENDENT_AMBULATORY_CARE_PROVIDER_SITE_OTHER): Payer: Self-pay | Admitting: *Deleted

## 2016-09-15 NOTE — Telephone Encounter (Signed)
The pantoprazole 40 mg was changed to take 1 po 30 minutes before breakfast daily. #90 with 1 refill. This was called to [facility]/[place].    Patient may take Zantac 150 mg as needed for break trhough symptoms. Patient was called and made aware.  Per Dr.Rehman the patient needs to have OV, he was last been in 2016.

## 2016-09-20 ENCOUNTER — Encounter (INDEPENDENT_AMBULATORY_CARE_PROVIDER_SITE_OTHER): Payer: Self-pay | Admitting: Internal Medicine

## 2016-09-20 NOTE — Telephone Encounter (Signed)
An appointment was given for 10/26/16 at 10:15am with Dorene Arerri Setzer, NP.  A letter was mailed to the patient.

## 2016-10-26 ENCOUNTER — Encounter (INDEPENDENT_AMBULATORY_CARE_PROVIDER_SITE_OTHER): Payer: Self-pay | Admitting: Internal Medicine

## 2016-10-26 ENCOUNTER — Ambulatory Visit (INDEPENDENT_AMBULATORY_CARE_PROVIDER_SITE_OTHER): Payer: BLUE CROSS/BLUE SHIELD | Admitting: Internal Medicine

## 2016-10-26 ENCOUNTER — Encounter (INDEPENDENT_AMBULATORY_CARE_PROVIDER_SITE_OTHER): Payer: Self-pay | Admitting: *Deleted

## 2016-10-26 VITALS — BP 120/90 | HR 64 | Temp 97.0°F | Ht 72.0 in | Wt 231.0 lb

## 2016-10-26 DIAGNOSIS — B1920 Unspecified viral hepatitis C without hepatic coma: Secondary | ICD-10-CM

## 2016-10-26 LAB — HEPATIC FUNCTION PANEL
ALBUMIN: 4.4 g/dL (ref 3.6–5.1)
ALK PHOS: 43 U/L (ref 40–115)
ALT: 18 U/L (ref 9–46)
AST: 17 U/L (ref 10–40)
Bilirubin, Direct: 0.2 mg/dL (ref <=0.2)
Indirect Bilirubin: 0.6 mg/dL (ref 0.2–1.2)
TOTAL PROTEIN: 7.3 g/dL (ref 6.1–8.1)
Total Bilirubin: 0.8 mg/dL (ref 0.2–1.2)

## 2016-10-26 LAB — CBC WITH DIFFERENTIAL/PLATELET
Basophils Absolute: 68 cells/uL (ref 0–200)
Basophils Relative: 1 %
EOS PCT: 3 %
Eosinophils Absolute: 204 cells/uL (ref 15–500)
HCT: 44.1 % (ref 38.5–50.0)
HEMOGLOBIN: 14.7 g/dL (ref 13.2–17.1)
LYMPHS ABS: 2108 {cells}/uL (ref 850–3900)
LYMPHS PCT: 31 %
MCH: 30.1 pg (ref 27.0–33.0)
MCHC: 33.3 g/dL (ref 32.0–36.0)
MCV: 90.4 fL (ref 80.0–100.0)
MONOS PCT: 7 %
MPV: 10.5 fL (ref 7.5–12.5)
Monocytes Absolute: 476 cells/uL (ref 200–950)
NEUTROS PCT: 58 %
Neutro Abs: 3944 cells/uL (ref 1500–7800)
PLATELETS: 262 10*3/uL (ref 140–400)
RBC: 4.88 MIL/uL (ref 4.20–5.80)
RDW: 12.5 % (ref 11.0–15.0)
WBC: 6.8 10*3/uL (ref 3.8–10.8)

## 2016-10-26 NOTE — Patient Instructions (Signed)
OV in 1 year.  
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
## 2016-10-28 LAB — HEPATITIS C RNA QUANTITATIVE
HCV Quantitative Log: <1.18 Log IU/mL — AB
HCV Quantitative: <15 IU/mL — AB

## 2016-11-02 ENCOUNTER — Ambulatory Visit (HOSPITAL_COMMUNITY)
Admission: RE | Admit: 2016-11-02 | Discharge: 2016-11-02 | Disposition: A | Discharge status: Home / Self Care | Payer: BLUE CROSS/BLUE SHIELD | Source: Ambulatory Visit | Attending: Internal Medicine | Admitting: Internal Medicine

## 2016-11-02 ENCOUNTER — Telehealth (INDEPENDENT_AMBULATORY_CARE_PROVIDER_SITE_OTHER): Payer: Self-pay | Admitting: Internal Medicine

## 2016-11-02 DIAGNOSIS — K760 Fatty (change of) liver, not elsewhere classified: Secondary | ICD-10-CM | POA: Insufficient documentation

## 2016-11-02 DIAGNOSIS — B1920 Unspecified viral hepatitis C without hepatic coma: Secondary | ICD-10-CM | POA: Insufficient documentation

## 2016-11-02 NOTE — Telephone Encounter (Signed)
David Anthony, Hep C quaint in 4 weeks. Please send letter

## 2016-11-03 ENCOUNTER — Other Ambulatory Visit (INDEPENDENT_AMBULATORY_CARE_PROVIDER_SITE_OTHER): Payer: Self-pay | Admitting: *Deleted

## 2016-11-03 ENCOUNTER — Encounter (INDEPENDENT_AMBULATORY_CARE_PROVIDER_SITE_OTHER): Payer: Self-pay | Admitting: *Deleted

## 2016-11-03 DIAGNOSIS — B182 Chronic viral hepatitis C: Secondary | ICD-10-CM

## 2016-11-03 NOTE — Telephone Encounter (Signed)
Lab is noted for March 28 th 2018. A letter has been sent as a reminder.

## 2016-11-08 ENCOUNTER — Telehealth (INDEPENDENT_AMBULATORY_CARE_PROVIDER_SITE_OTHER): Payer: Self-pay | Admitting: Internal Medicine

## 2016-11-09 NOTE — Telephone Encounter (Signed)
eror

## 2016-12-06 LAB — HEPATITIS C RNA QUANTITATIVE
HCV Quantitative Log: <1.18 Log IU/mL
HCV Quantitative: <15 IU/mL

## 2016-12-08 ENCOUNTER — Other Ambulatory Visit (INDEPENDENT_AMBULATORY_CARE_PROVIDER_SITE_OTHER): Payer: Self-pay | Admitting: *Deleted

## 2016-12-08 DIAGNOSIS — B182 Chronic viral hepatitis C: Secondary | ICD-10-CM

## 2017-03-07 ENCOUNTER — Other Ambulatory Visit (INDEPENDENT_AMBULATORY_CARE_PROVIDER_SITE_OTHER): Payer: Self-pay | Admitting: Internal Medicine

## 2017-04-04 ENCOUNTER — Encounter (INDEPENDENT_AMBULATORY_CARE_PROVIDER_SITE_OTHER): Payer: Self-pay | Admitting: *Deleted

## 2017-06-02 ENCOUNTER — Other Ambulatory Visit (INDEPENDENT_AMBULATORY_CARE_PROVIDER_SITE_OTHER): Payer: Self-pay | Admitting: *Deleted

## 2017-06-02 ENCOUNTER — Encounter (INDEPENDENT_AMBULATORY_CARE_PROVIDER_SITE_OTHER): Payer: Self-pay | Admitting: *Deleted

## 2017-06-02 DIAGNOSIS — B182 Chronic viral hepatitis C: Secondary | ICD-10-CM

## 2017-06-29 LAB — HEPATITIS C RNA QUANTITATIVE
HCV Quantitative Log: <1.18 Log IU/mL
HCV RNA, PCR, QN: <15 IU/mL

## 2017-09-03 ENCOUNTER — Other Ambulatory Visit (INDEPENDENT_AMBULATORY_CARE_PROVIDER_SITE_OTHER): Payer: Self-pay | Admitting: Internal Medicine

## 2017-09-15 ENCOUNTER — Encounter (INDEPENDENT_AMBULATORY_CARE_PROVIDER_SITE_OTHER): Payer: Self-pay | Admitting: Internal Medicine

## 2017-09-20 IMAGING — US US ABDOMEN LIMITED
1 series · 14 of 25 positions shown · non-contrast
Comparison: 10/30/2014 .

CLINICAL DATA: Hepatitis-C.

EXAM:
US ABDOMEN LIMITED - RIGHT UPPER QUADRANT

[Series 1: us abdomen limited · 0.19mm/px · 14 of 67 slices shown]
[im 1/67]
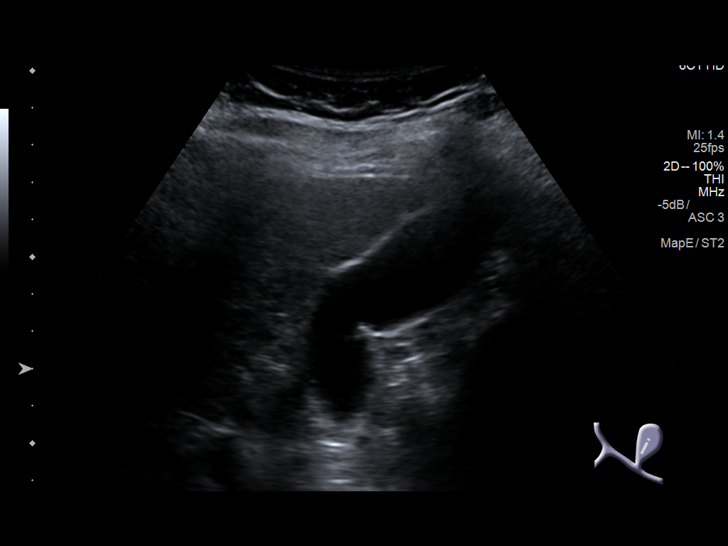
[im 6/67]
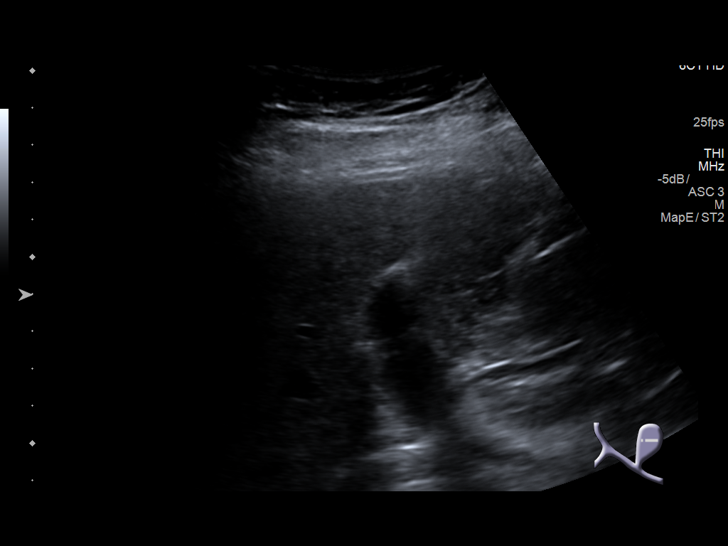
[im 12/67]
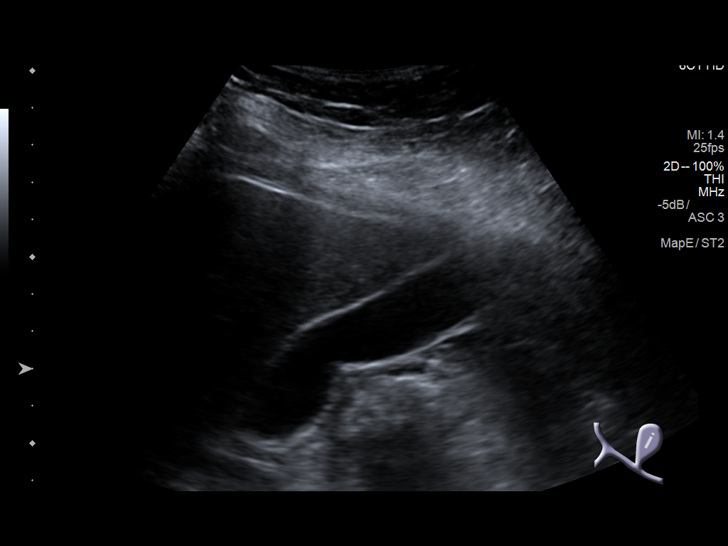
[im 17/67]
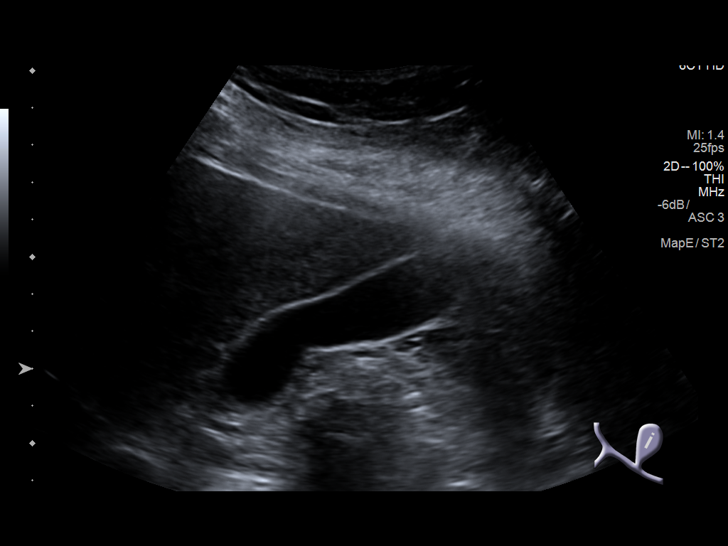
[im 23/67]
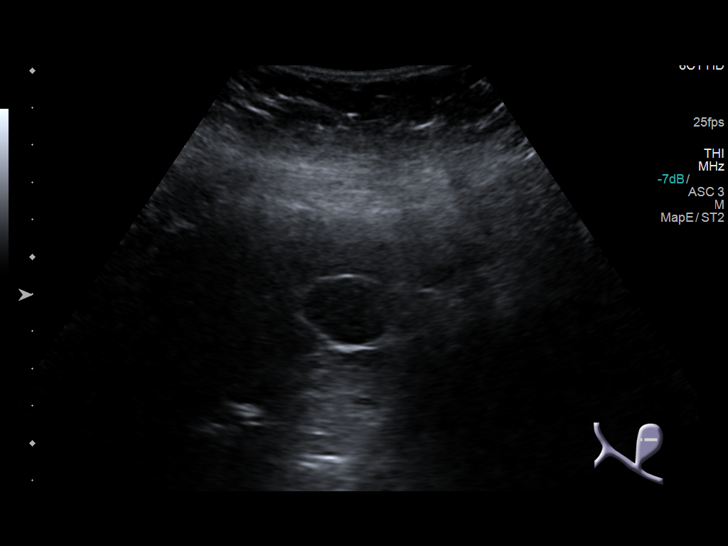
[im 25/67]
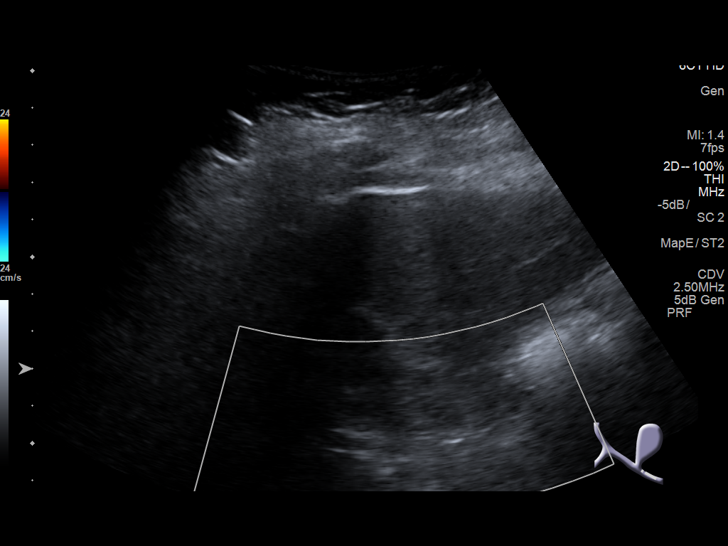
[im 31/67]
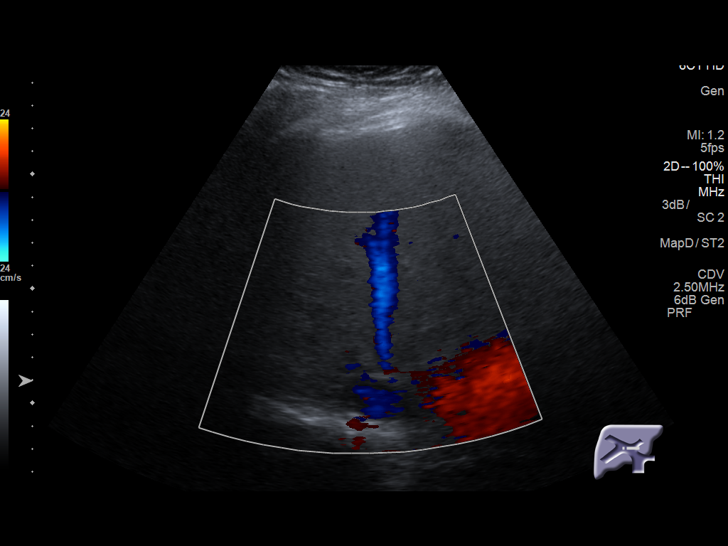
[im 36/67]
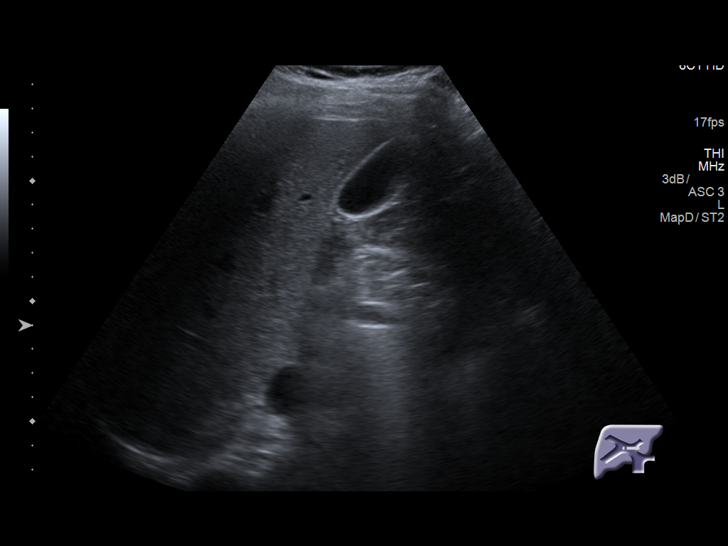
[im 42/67]
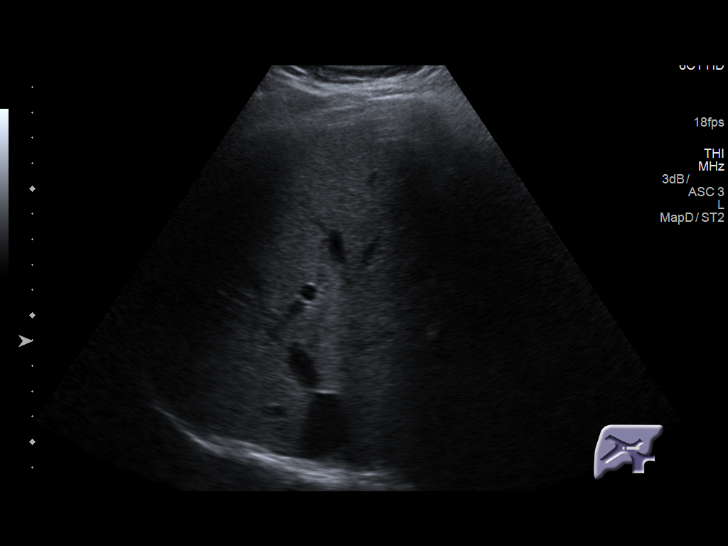
[im 45/67]
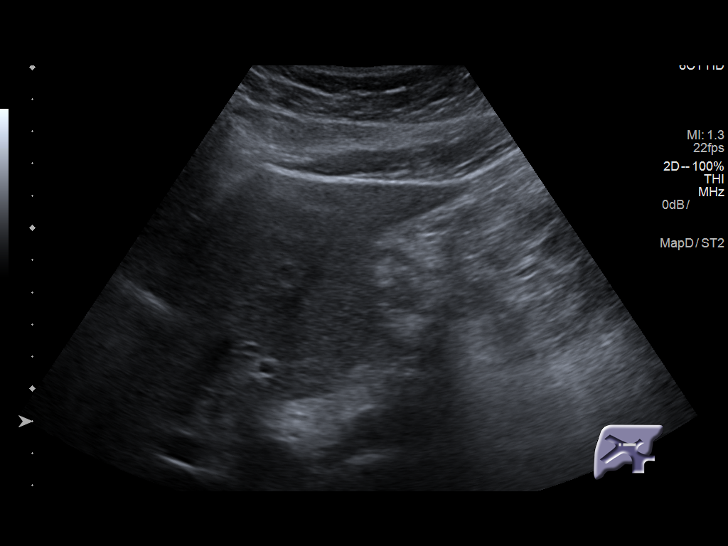
[im 50/67]
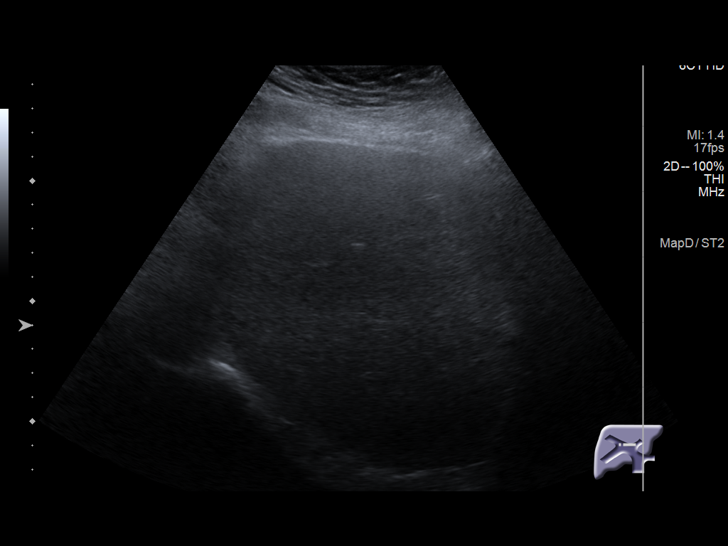
[im 56/67]
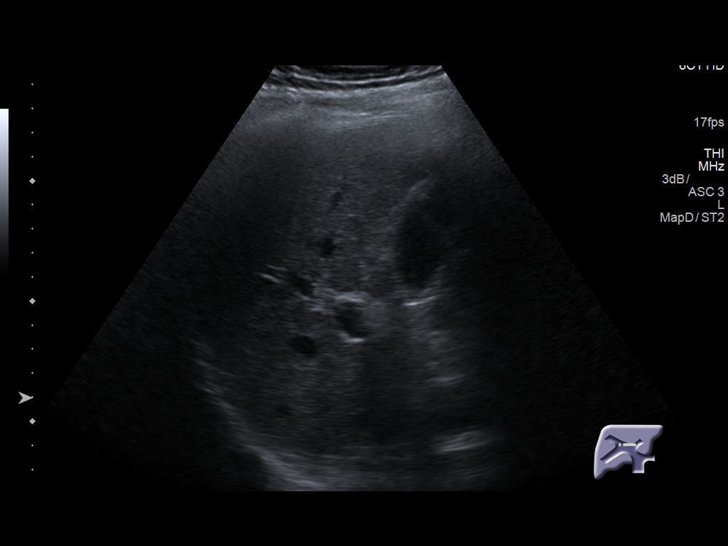
[im 61/67]
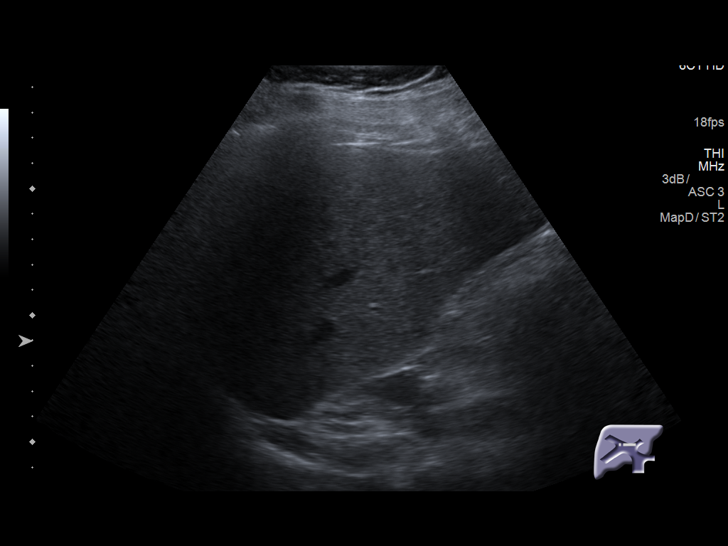
[im 67/67]
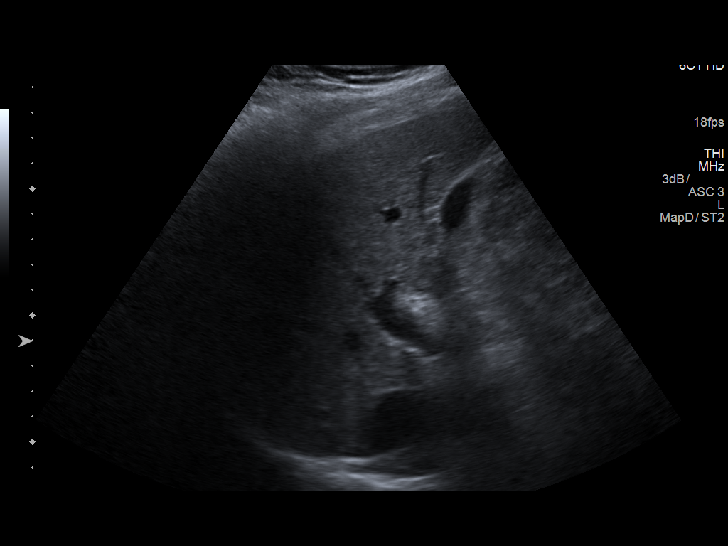

[14 of 25 positions shown; findings below may reference images not displayed]

FINDINGS: Gallbladder:

No gallstones or wall thickening visualized. No sonographic Murphy
sign noted by sonographer.

Common bile duct:

Diameter: 3.8 mm

Liver:

Increased echogenicity noted of the liver consistent fatty
infiltration and/or hepatocellular disease. No focal hepatic
abnormality identified.
IMPRESSION: 1. Increased echogenicity of the liver consistent fatty infiltration
or hepatocellular disease. No focal hepatic abnormality identified.

2. Exam otherwise unremarkable. No gallstones or biliary distention
.

## 2017-10-26 ENCOUNTER — Encounter (INDEPENDENT_AMBULATORY_CARE_PROVIDER_SITE_OTHER): Payer: Self-pay | Admitting: Internal Medicine

## 2017-10-26 ENCOUNTER — Encounter (INDEPENDENT_AMBULATORY_CARE_PROVIDER_SITE_OTHER): Payer: Self-pay | Admitting: *Deleted

## 2017-10-26 ENCOUNTER — Ambulatory Visit (INDEPENDENT_AMBULATORY_CARE_PROVIDER_SITE_OTHER): Payer: BLUE CROSS/BLUE SHIELD | Admitting: Internal Medicine

## 2017-10-26 VITALS — BP 114/82 | HR 64 | Temp 98.4°F | Ht 72.0 in | Wt 224.0 lb

## 2017-10-26 DIAGNOSIS — B182 Chronic viral hepatitis C: Secondary | ICD-10-CM | POA: Diagnosis not present

## 2017-10-26 MED ORDER — PANTOPRAZOLE SODIUM 40 MG PO TBEC: DELAYED_RELEASE_TABLET | ORAL | 3 refills | Status: DC
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
## 2017-10-26 NOTE — Patient Instructions (Signed)
Labs and US. OV in 1 year. 

## 2017-10-28 ENCOUNTER — Ambulatory Visit (HOSPITAL_COMMUNITY)
Admission: RE | Admit: 2017-10-28 | Discharge: 2017-10-28 | Disposition: A | Discharge status: Home / Self Care | Payer: BLUE CROSS/BLUE SHIELD | Source: Ambulatory Visit | Attending: Internal Medicine | Admitting: Internal Medicine

## 2017-10-28 DIAGNOSIS — B182 Chronic viral hepatitis C: Secondary | ICD-10-CM | POA: Diagnosis not present

## 2017-11-01 ENCOUNTER — Telehealth (INDEPENDENT_AMBULATORY_CARE_PROVIDER_SITE_OTHER): Payer: Self-pay | Admitting: *Deleted

## 2017-11-01 NOTE — Telephone Encounter (Signed)
[facility] called stating pantoprazole needs a coverage review. Please call [phone] ref # [id number]

## 2017-11-01 NOTE — Telephone Encounter (Signed)
I have switched him to Nexium

## 2017-11-02 ENCOUNTER — Telehealth (INDEPENDENT_AMBULATORY_CARE_PROVIDER_SITE_OTHER): Payer: Self-pay | Admitting: Internal Medicine

## 2017-11-02 DIAGNOSIS — K219 Gastro-esophageal reflux disease without esophagitis: Secondary | ICD-10-CM

## 2017-11-02 MED ORDER — PANTOPRAZOLE SODIUM 40 MG PO TBEC: 40.0000 mg | DELAYED_RELEASE_TABLET | Freq: Every day | ORAL | 3 refills | Status: DC

## 2017-11-02 NOTE — Telephone Encounter (Signed)
Message left on his phone

## 2017-11-02 NOTE — Telephone Encounter (Signed)
Rx for Protonix 40mg sent to his pharmacy  

## 2017-11-02 NOTE — Telephone Encounter (Signed)
I have called [facility], the pharmacist Onalee HuaDavid stated the nexium is not covered period, the pantoprozole is covered with 1 a day. Please advise [id number] ref # [id number]

## 2017-11-02 NOTE — Telephone Encounter (Signed)
Am going to call in an Rx for Protonix 40mg  and he can take Zantac at night

## 2018-03-04 ENCOUNTER — Other Ambulatory Visit (INDEPENDENT_AMBULATORY_CARE_PROVIDER_SITE_OTHER): Payer: Self-pay | Admitting: Internal Medicine

## 2018-04-05 ENCOUNTER — Encounter (INDEPENDENT_AMBULATORY_CARE_PROVIDER_SITE_OTHER): Payer: Self-pay | Admitting: *Deleted

## 2018-07-05 ENCOUNTER — Telehealth (INDEPENDENT_AMBULATORY_CARE_PROVIDER_SITE_OTHER): Payer: Self-pay | Admitting: Internal Medicine

## 2018-07-05 ENCOUNTER — Telehealth (INDEPENDENT_AMBULATORY_CARE_PROVIDER_SITE_OTHER): Payer: Self-pay | Admitting: *Deleted

## 2018-07-05 DIAGNOSIS — B182 Chronic viral hepatitis C: Secondary | ICD-10-CM

## 2018-07-05 NOTE — Telephone Encounter (Signed)
Ordered

## 2018-07-05 NOTE — Telephone Encounter (Signed)
Ann, US ordered 

## 2018-07-05 NOTE — Telephone Encounter (Signed)
Patient on recall for 6 mth [place] RUQ, need order

## 2018-07-06 NOTE — Telephone Encounter (Signed)
[place] sch'd 07/14/18 at [phone] ([phone]), npo after midnight, patient aware

## 2018-07-06 NOTE — Telephone Encounter (Signed)
US sch'd 07/14/18 at [phone] ([phone]), npo after midnight, patient aware 

## 2018-07-14 ENCOUNTER — Ambulatory Visit (HOSPITAL_COMMUNITY)
Admission: RE | Admit: 2018-07-14 | Discharge: 2018-07-14 | Disposition: A | Discharge status: Home / Self Care | Payer: BLUE CROSS/BLUE SHIELD | Source: Ambulatory Visit | Attending: Internal Medicine | Admitting: Internal Medicine

## 2018-07-14 DIAGNOSIS — B182 Chronic viral hepatitis C: Secondary | ICD-10-CM | POA: Insufficient documentation

## 2018-09-15 IMAGING — US US ABDOMEN LIMITED
1 series · 14 of 25 positions shown · non-contrast
Comparison: Ultrasound right upper quadrant November 02, 2016

CLINICAL DATA: Hepatitis-C

EXAM:
ULTRASOUND ABDOMEN LIMITED RIGHT UPPER QUADRANT

[Series 1: us abdomen limited · 0.19mm/px · 14 of 66 slices shown]
[im 1/66]
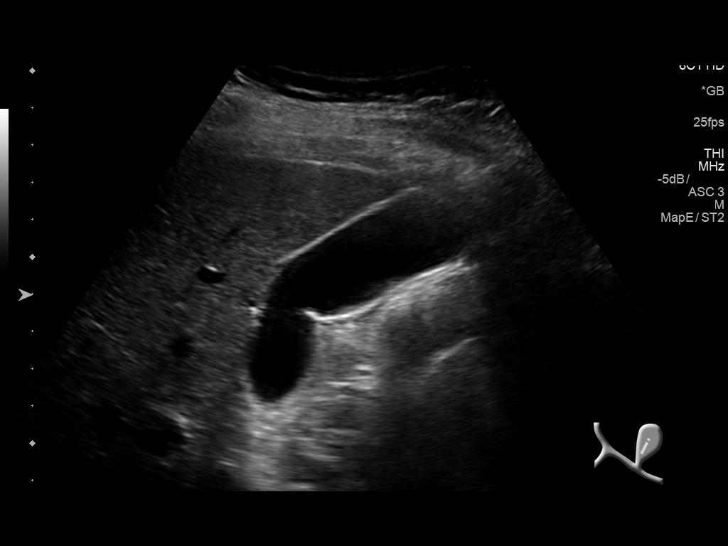
[im 6/66]
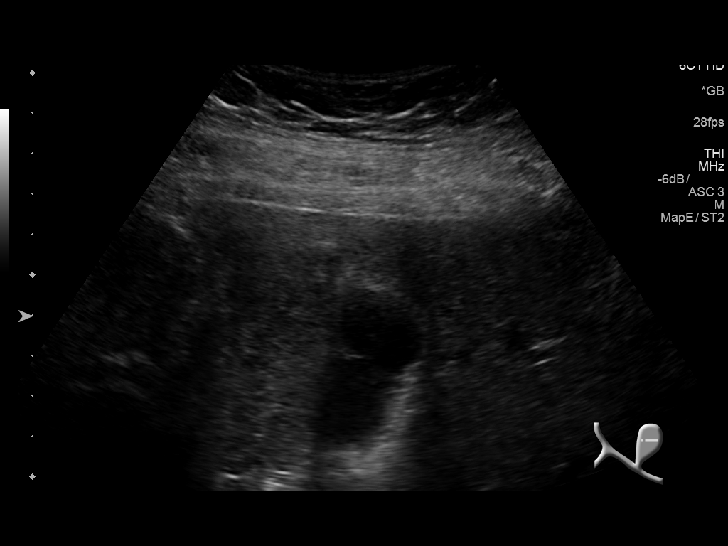
[im 11/66]
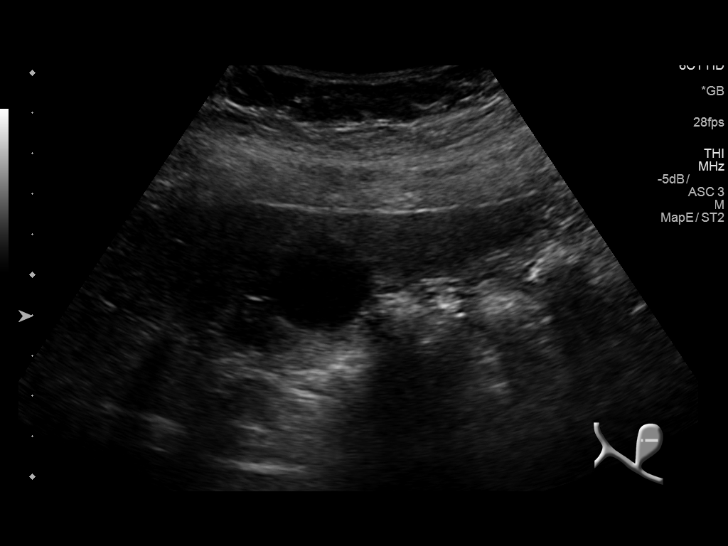
[im 17/66]
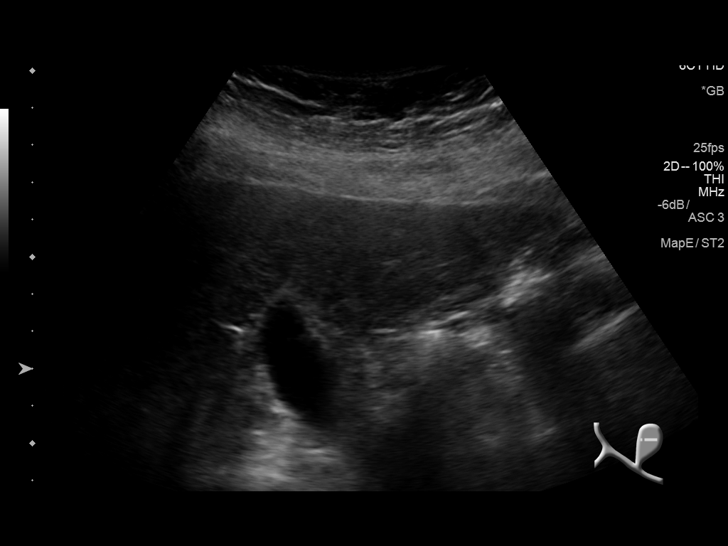
[im 22/66]
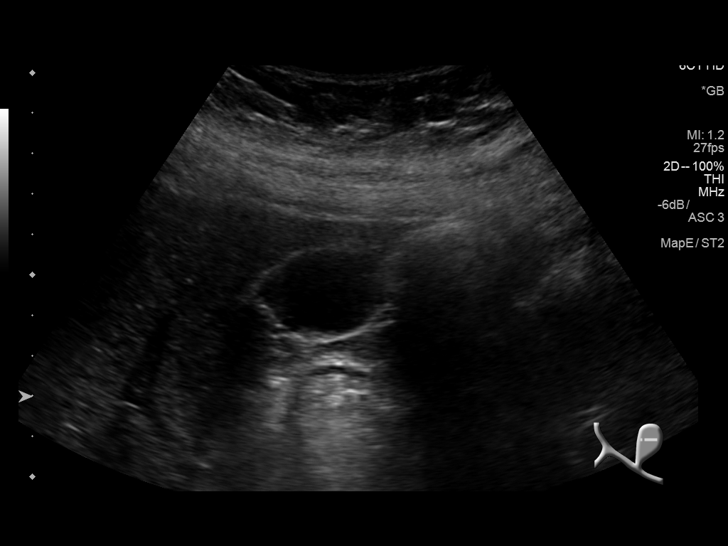
[im 25/66]
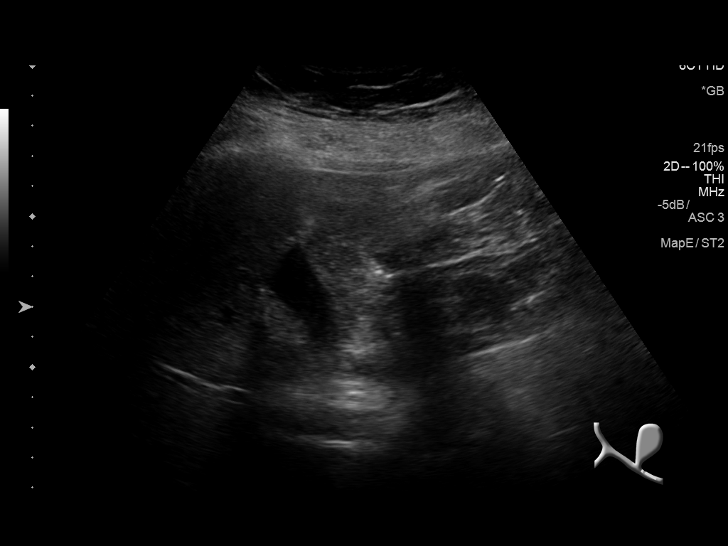
[im 30/66]
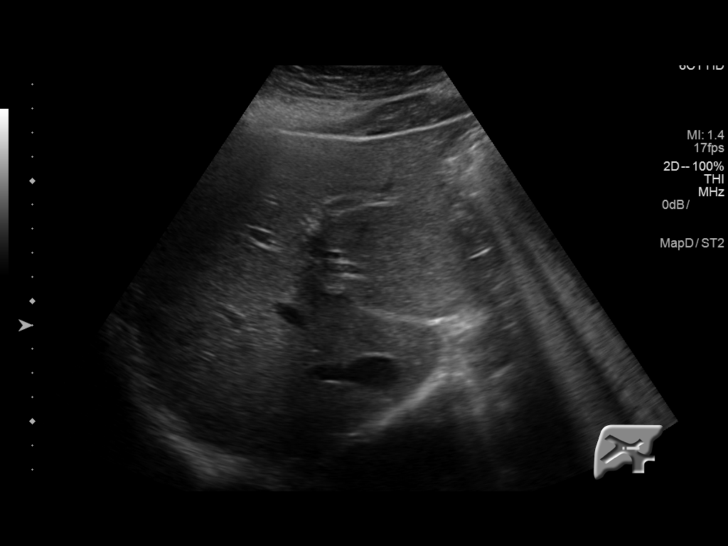
[im 36/66]
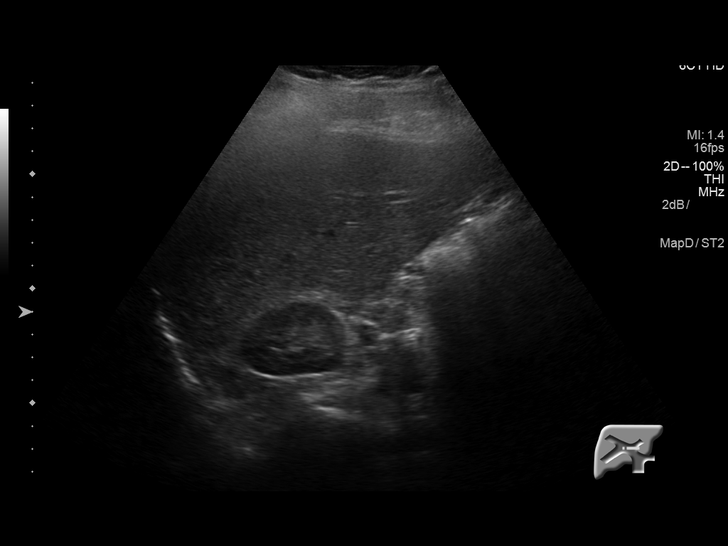
[im 41/66]
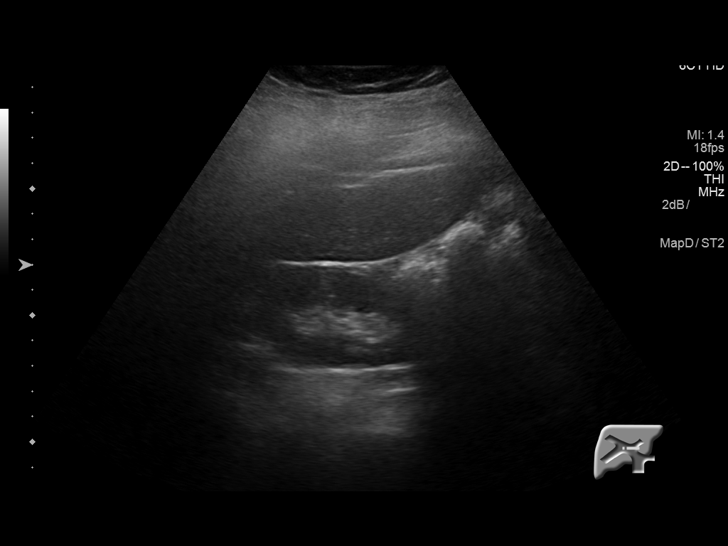
[im 44/66]
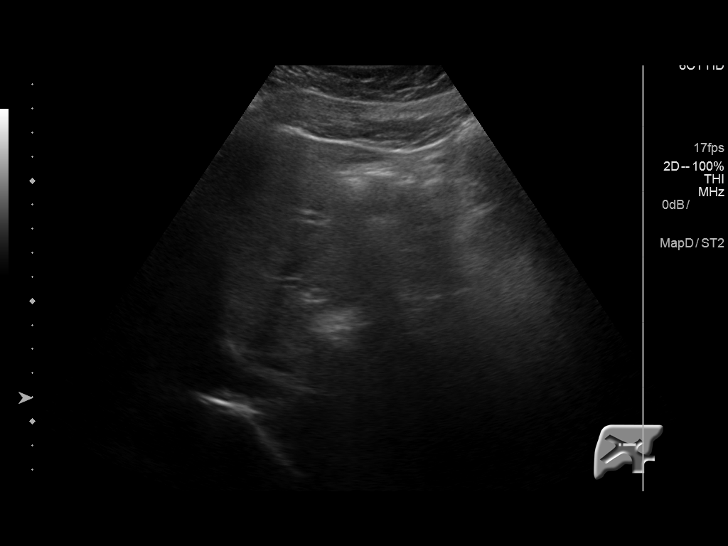
[im 49/66]
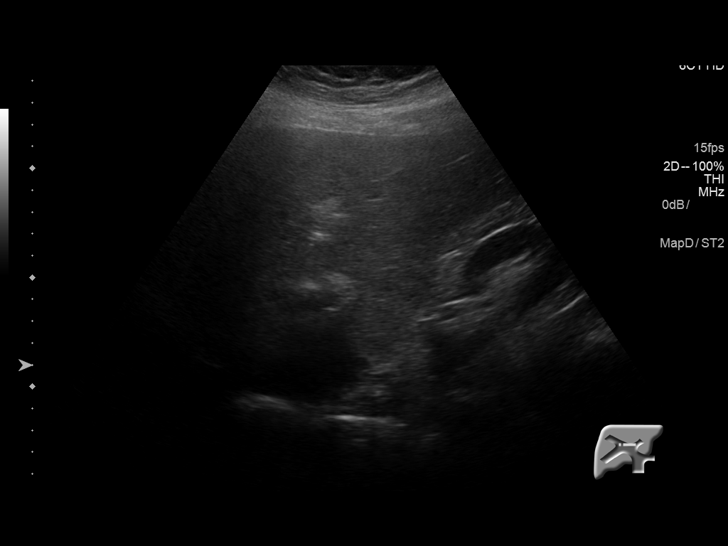
[im 55/66]
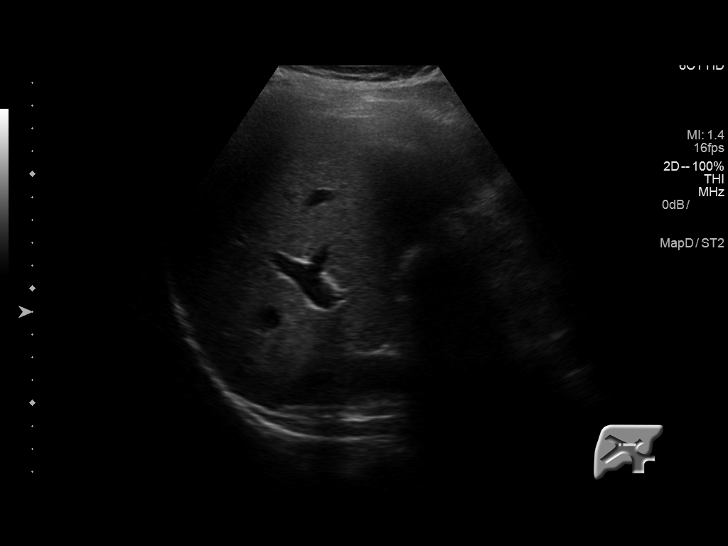
[im 60/66]
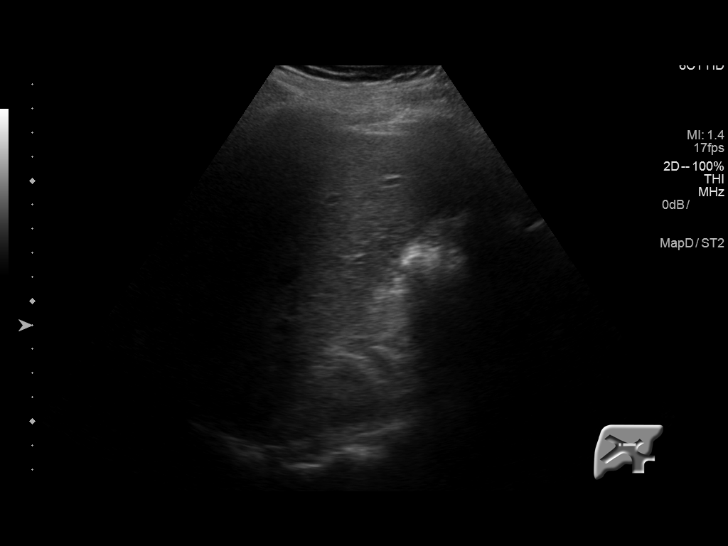
[im 66/66]
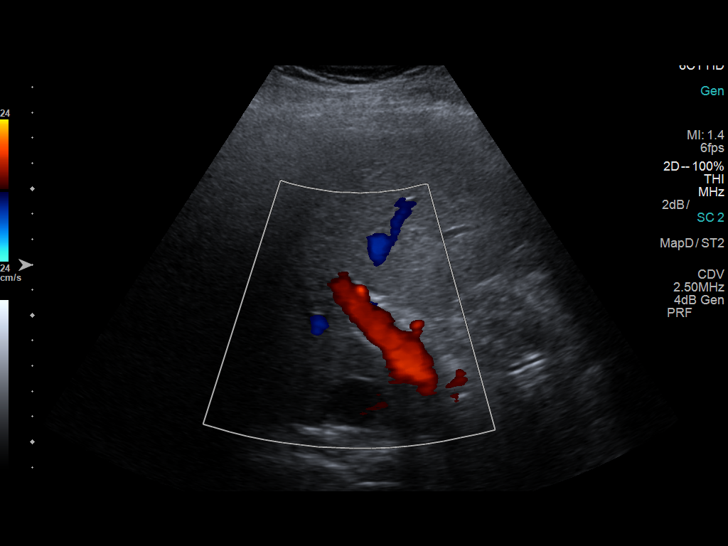

[14 of 25 positions shown; findings below may reference images not displayed]

FINDINGS: Gallbladder:

No gallstones or wall thickening visualized. There is no
pericholecystic fluid. No sonographic Murphy sign noted by
sonographer.

Common bile duct:

Diameter: 2 mm. No intrahepatic or extrahepatic biliary duct
dilatation.

Liver:

No focal lesion identified. Liver echogenicity overall is increased.
Portal vein is patent on color Doppler imaging with normal direction
of blood flow towards the liver.
IMPRESSION: Diffuse increase in liver echogenicity, a finding felt to be
indicative of hepatic steatosis with potential underlying
parenchymal liver disease. While no focal liver lesions are evident
on this study, it must be cautioned that the sensitivity of
ultrasound for detection of focal liver lesions is diminished in
this circumstance. Study otherwise unremarkable.

## 2018-10-31 ENCOUNTER — Ambulatory Visit (INDEPENDENT_AMBULATORY_CARE_PROVIDER_SITE_OTHER): Payer: BLUE CROSS/BLUE SHIELD | Admitting: Internal Medicine

## 2018-11-15 ENCOUNTER — Telehealth (INDEPENDENT_AMBULATORY_CARE_PROVIDER_SITE_OTHER): Payer: Self-pay | Admitting: Internal Medicine

## 2018-11-15 ENCOUNTER — Other Ambulatory Visit: Payer: Self-pay

## 2018-11-15 ENCOUNTER — Ambulatory Visit (INDEPENDENT_AMBULATORY_CARE_PROVIDER_SITE_OTHER): Payer: BLUE CROSS/BLUE SHIELD | Admitting: Internal Medicine

## 2018-11-15 ENCOUNTER — Encounter (INDEPENDENT_AMBULATORY_CARE_PROVIDER_SITE_OTHER): Payer: Self-pay | Admitting: Internal Medicine

## 2018-11-15 VITALS — BP 104/70 | HR 60 | Temp 98.3°F | Ht 69.0 in | Wt 228.9 lb

## 2018-11-15 DIAGNOSIS — K2270 Barrett's esophagus without dysplasia: Secondary | ICD-10-CM | POA: Diagnosis not present

## 2018-11-15 DIAGNOSIS — B182 Chronic viral hepatitis C: Secondary | ICD-10-CM

## 2018-11-15 NOTE — Telephone Encounter (Signed)
Will need surveillance EGD in 2021 for hx of Barrett's.
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
## 2018-11-15 NOTE — Patient Instructions (Signed)
OV in 1 year.  

## 2018-11-16 NOTE — Telephone Encounter (Signed)
EGD noted in recall for March 2021

## 2018-11-17 LAB — HEPATITIS C RNA QUANTITATIVE
HCV Quantitative Log: <1.18 Log IU/mL
HCV RNA, PCR, QN: <15 IU/mL

## 2018-11-17 LAB — HEPATIC FUNCTION PANEL
AG Ratio: 1.8 (calc) (ref 1.0–2.5)
ALKALINE PHOSPHATASE (APISO): 46 U/L (ref 36–130)
ALT: 17 U/L (ref 9–46)
AST: 14 U/L (ref 10–40)
Albumin: 4.4 g/dL (ref 3.6–5.1)
BILIRUBIN TOTAL: 0.7 mg/dL (ref 0.2–1.2)
Bilirubin, Direct: 0.1 mg/dL (ref 0.0–0.2)
Globulin: 2.5 g/dL (calc) (ref 1.9–3.7)
Indirect Bilirubin: 0.6 mg/dL (calc) (ref 0.2–1.2)
TOTAL PROTEIN: 6.9 g/dL (ref 6.1–8.1)

## 2019-01-26 IMAGING — US ULTRASOUND ABDOMEN LIMITED
1 series · 14 of 25 positions shown · non-contrast
Comparison: October 28, 2017

CLINICAL DATA: Hepatitis C followup

EXAM:
ULTRASOUND ABDOMEN LIMITED RIGHT UPPER QUADRANT

[Series 1: ultrasound abdomen limited · 14 of 56 slices shown]
[im 1/56]
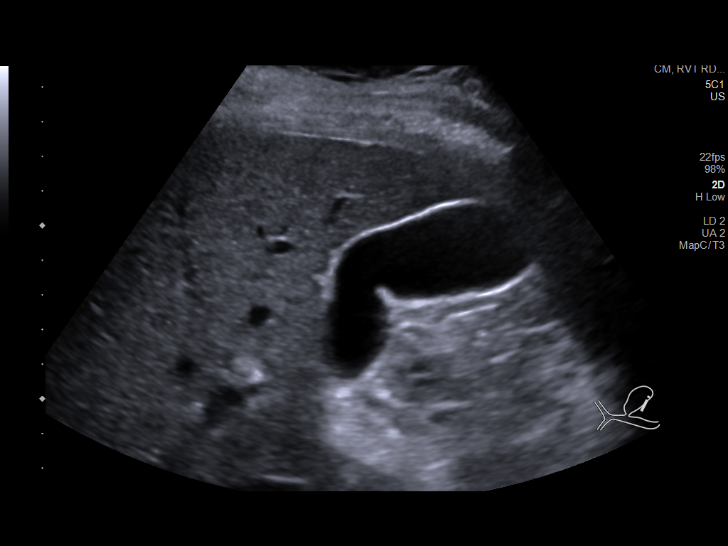
[im 5/56]
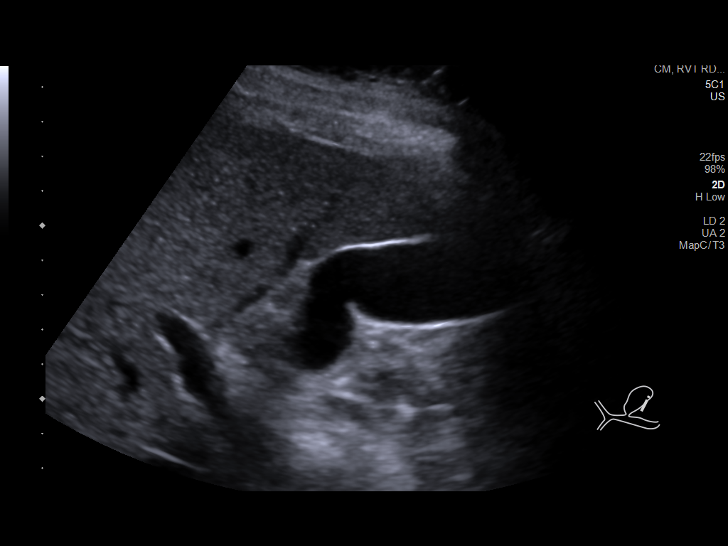
[im 10/56]
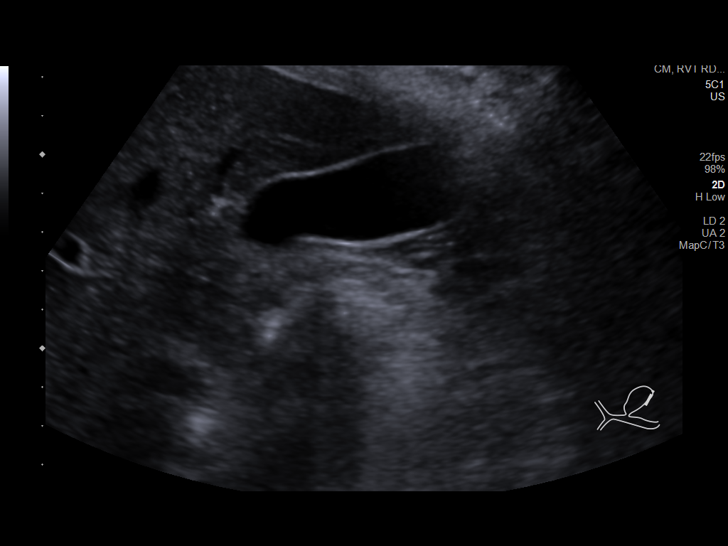
[im 14/56]
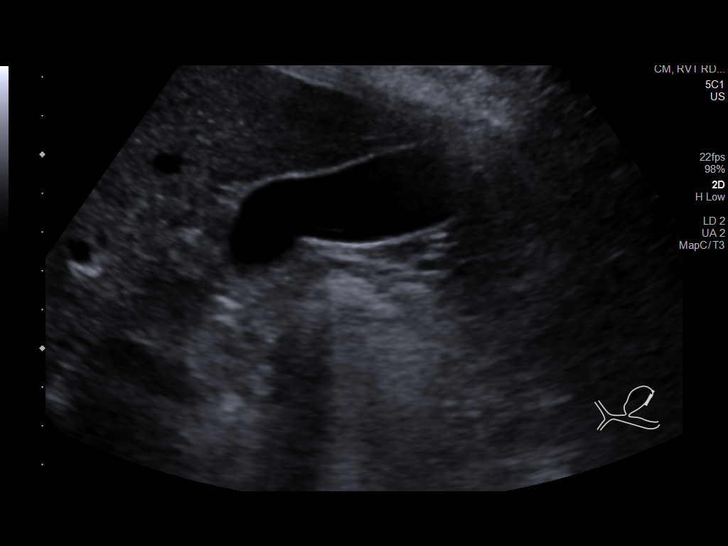
[im 19/56]
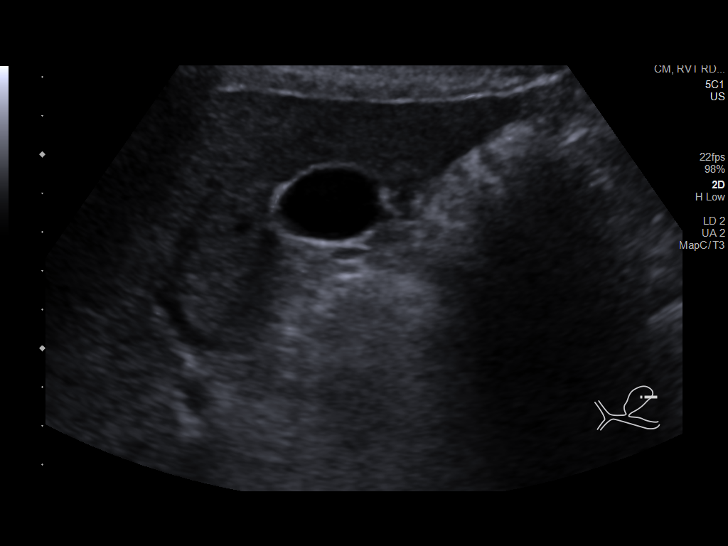
[im 21/56]
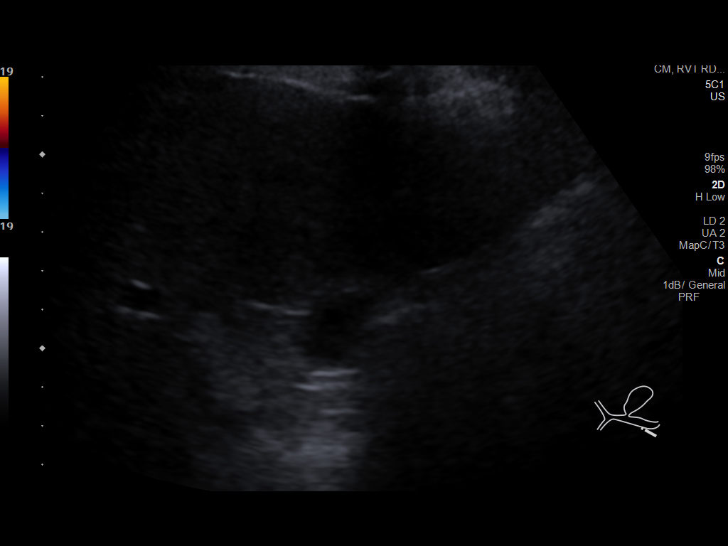
[im 26/56]
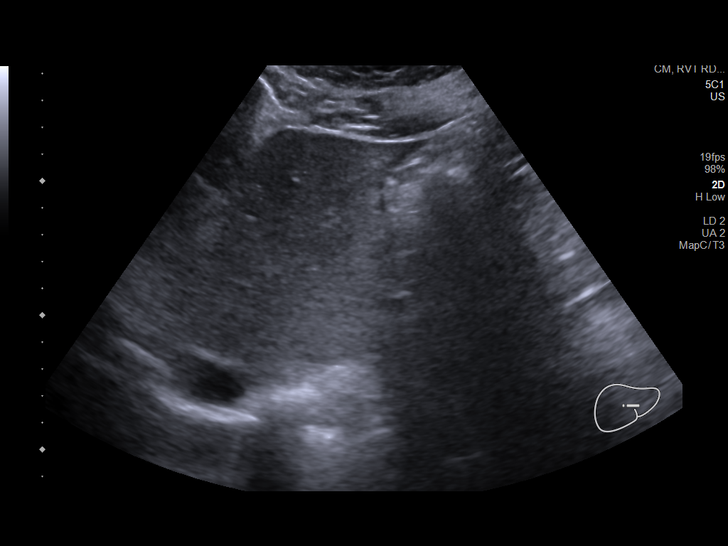
[im 30/56]
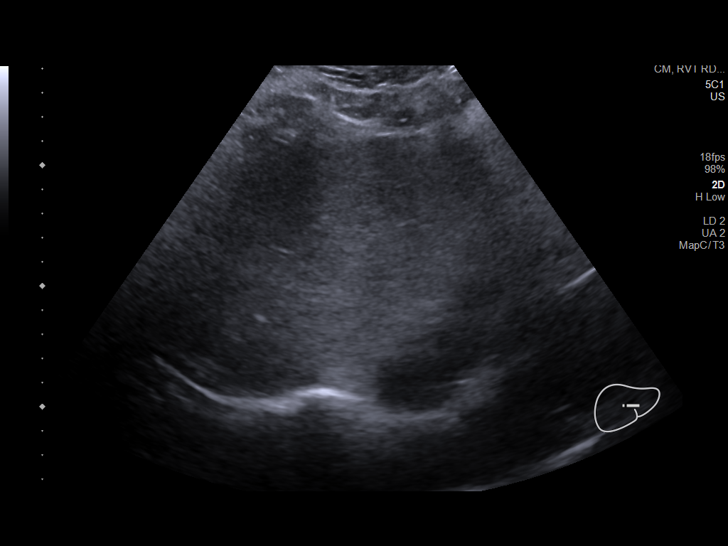
[im 35/56]
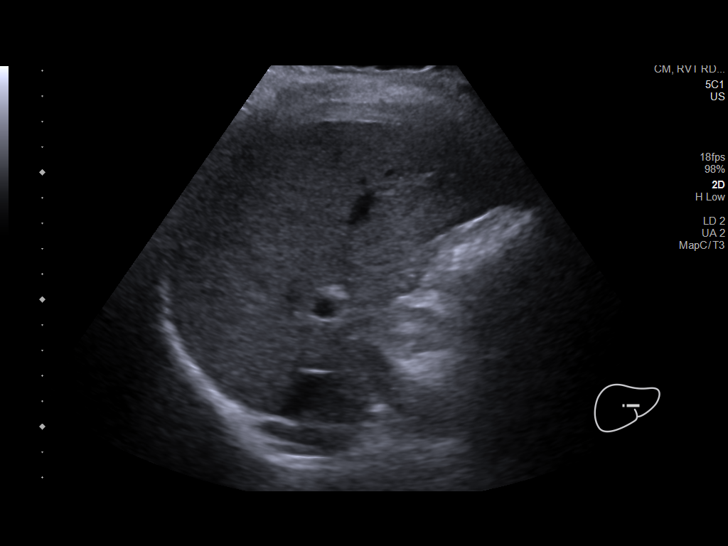
[im 37/56]
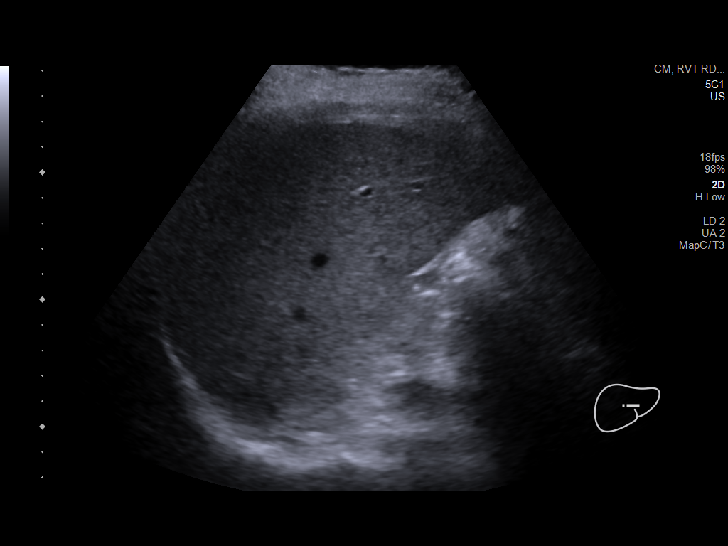
[im 42/56]
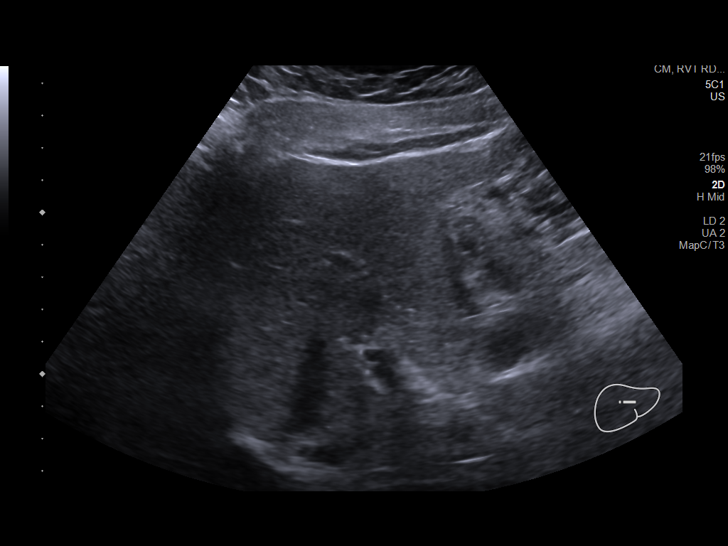
[im 46/56]
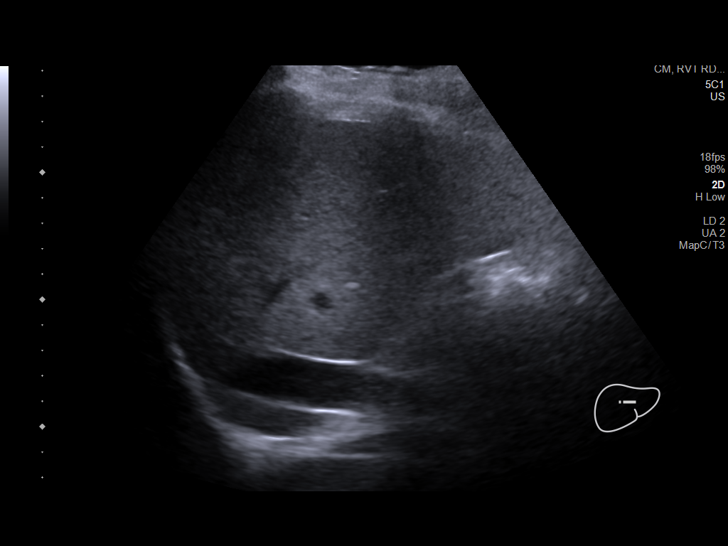
[im 51/56]
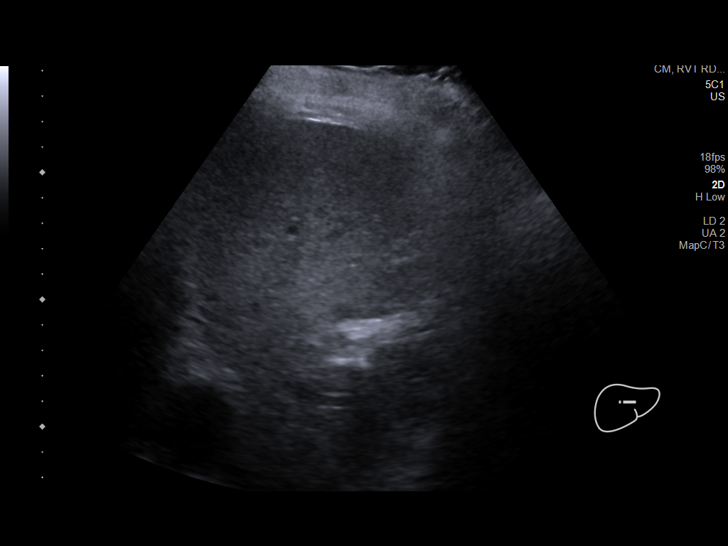
[im 56/56]
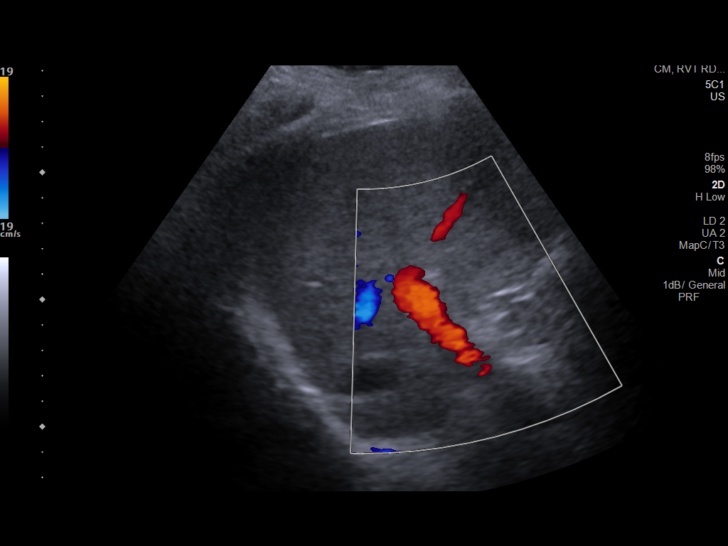

[14 of 25 positions shown; findings below may reference images not displayed]

FINDINGS: Gallbladder:

No gallstones or wall thickening visualized. No sonographic Murphy
sign noted by sonographer.

Common bile duct:

Diameter: 2.8 mm

Liver:

No focal lesion identified. Mild diffuse increased echotexture of
the liver is identified. Portal vein is patent on color Doppler
imaging with normal direction of blood flow towards the liver.
IMPRESSION: Mild diffuse increased echotexture of the liver noted. No definite
focal liver lesion is identified.

## 2019-07-03 ENCOUNTER — Encounter (INDEPENDENT_AMBULATORY_CARE_PROVIDER_SITE_OTHER): Payer: Self-pay | Admitting: *Deleted

## 2019-11-01 ENCOUNTER — Encounter (INDEPENDENT_AMBULATORY_CARE_PROVIDER_SITE_OTHER): Payer: Self-pay | Admitting: *Deleted

## 2019-11-15 ENCOUNTER — Ambulatory Visit (INDEPENDENT_AMBULATORY_CARE_PROVIDER_SITE_OTHER): Payer: BLUE CROSS/BLUE SHIELD | Admitting: Nurse Practitioner

## 2019-12-10 ENCOUNTER — Other Ambulatory Visit (INDEPENDENT_AMBULATORY_CARE_PROVIDER_SITE_OTHER): Payer: Self-pay | Admitting: *Deleted

## 2019-12-10 ENCOUNTER — Encounter (INDEPENDENT_AMBULATORY_CARE_PROVIDER_SITE_OTHER): Payer: Self-pay | Admitting: *Deleted

## 2019-12-10 DIAGNOSIS — K2270 Barrett's esophagus without dysplasia: Secondary | ICD-10-CM

## 2019-12-19 ENCOUNTER — Other Ambulatory Visit: Payer: Self-pay

## 2019-12-19 ENCOUNTER — Ambulatory Visit (INDEPENDENT_AMBULATORY_CARE_PROVIDER_SITE_OTHER): Payer: Self-pay

## 2019-12-19 ENCOUNTER — Telehealth (INDEPENDENT_AMBULATORY_CARE_PROVIDER_SITE_OTHER): Payer: Self-pay | Admitting: *Deleted

## 2019-12-19 NOTE — Telephone Encounter (Signed)
Referring MD/PCP:    Procedure: egd  Reason/Indication:  Barrett's esophagus  Has patient had this procedure before?  Yes, 11/2014  If so, when, by whom and where?    Is there a family history of colon cancer?    Who?  What age when diagnosed?    Is patient diabetic?   no      Does patient have prosthetic heart valve or mechanical valve?  no  Do you have a pacemaker/defibrillator?  no  Has patient ever had endocarditis/atrial fibrillation? no  Does patient use oxygen? no  Has patient had joint replacement within last 12 months?  no  Is patient constipated or do they take laxatives? no  Does patient have a history of alcohol/drug use?  no  Is patient on blood thinner such as Coumadin, Plavix and/or Aspirin? no  Medications: nexium daily  Allergies: nkda  Medication Adjustment per Dr Karilyn Cota:   Procedure date & time: 01/09/20

## 2020-01-04 NOTE — Telephone Encounter (Signed)
Ok to schedule.

## 2020-01-07 ENCOUNTER — Other Ambulatory Visit: Payer: Self-pay

## 2020-01-07 ENCOUNTER — Other Ambulatory Visit (HOSPITAL_COMMUNITY)
Admission: RE | Admit: 2020-01-07 | Discharge: 2020-01-07 | Disposition: A | Discharge status: Home / Self Care | Payer: BLUE CROSS/BLUE SHIELD | Source: Ambulatory Visit | Attending: Internal Medicine | Admitting: Internal Medicine

## 2020-01-07 DIAGNOSIS — Z20822 Contact with and (suspected) exposure to covid-19: Secondary | ICD-10-CM | POA: Diagnosis not present

## 2020-01-07 DIAGNOSIS — Z01812 Encounter for preprocedural laboratory examination: Secondary | ICD-10-CM | POA: Insufficient documentation

## 2020-01-08 LAB — SARS CORONAVIRUS 2 (TAT 6-24 HRS): SARS Coronavirus 2: NEGATIVE

## 2020-01-09 ENCOUNTER — Ambulatory Visit (HOSPITAL_COMMUNITY)
Admission: RE | Admit: 2020-01-09 | Discharge: 2020-01-09 | Disposition: A | Discharge status: Home / Self Care | Payer: BLUE CROSS/BLUE SHIELD | Attending: Internal Medicine | Admitting: Internal Medicine

## 2020-01-09 ENCOUNTER — Encounter (HOSPITAL_COMMUNITY)
Admission: RE | Disposition: A | Discharge status: Home / Self Care | Payer: Self-pay | Source: Home / Self Care | Attending: Internal Medicine

## 2020-01-09 ENCOUNTER — Other Ambulatory Visit: Payer: Self-pay

## 2020-01-09 ENCOUNTER — Encounter (HOSPITAL_COMMUNITY): Payer: Self-pay | Admitting: Internal Medicine

## 2020-01-09 DIAGNOSIS — Z79899 Other long term (current) drug therapy: Secondary | ICD-10-CM | POA: Diagnosis not present

## 2020-01-09 DIAGNOSIS — K449 Diaphragmatic hernia without obstruction or gangrene: Secondary | ICD-10-CM | POA: Insufficient documentation

## 2020-01-09 DIAGNOSIS — K228 Other specified diseases of esophagus: Secondary | ICD-10-CM | POA: Diagnosis not present

## 2020-01-09 DIAGNOSIS — K219 Gastro-esophageal reflux disease without esophagitis: Secondary | ICD-10-CM | POA: Diagnosis not present

## 2020-01-09 DIAGNOSIS — K2270 Barrett's esophagus without dysplasia: Secondary | ICD-10-CM | POA: Diagnosis not present

## 2020-01-09 DIAGNOSIS — K2950 Unspecified chronic gastritis without bleeding: Secondary | ICD-10-CM | POA: Insufficient documentation

## 2020-01-09 DIAGNOSIS — Z791 Long term (current) use of non-steroidal anti-inflammatories (NSAID): Secondary | ICD-10-CM | POA: Diagnosis not present

## 2020-01-09 DIAGNOSIS — Z87891 Personal history of nicotine dependence: Secondary | ICD-10-CM | POA: Insufficient documentation

## 2020-01-09 DIAGNOSIS — R1314 Dysphagia, pharyngoesophageal phase: Secondary | ICD-10-CM | POA: Diagnosis not present

## 2020-01-09 DIAGNOSIS — Z8619 Personal history of other infectious and parasitic diseases: Secondary | ICD-10-CM | POA: Insufficient documentation

## 2020-01-09 DIAGNOSIS — Z8719 Personal history of other diseases of the digestive system: Secondary | ICD-10-CM

## 2020-01-09 DIAGNOSIS — Z09 Encounter for follow-up examination after completed treatment for conditions other than malignant neoplasm: Secondary | ICD-10-CM | POA: Diagnosis not present

## 2020-01-09 HISTORY — PX: BIOPSY: SHX5522

## 2020-01-09 HISTORY — PX: ESOPHAGOGASTRODUODENOSCOPY: SHX5428

## 2020-01-09 SURGERY — EGD (ESOPHAGOGASTRODUODENOSCOPY)
Anesthesia: Moderate Sedation

## 2020-01-09 MED ORDER — PROMETHAZINE HCL 25 MG/ML IJ SOLN
INTRAMUSCULAR | Status: DC | PRN
  Administered 2020-01-09: 12.5 mg via INTRAVENOUS

## 2020-01-09 MED ORDER — SODIUM CHLORIDE FLUSH 0.9 % IV SOLN
INTRAVENOUS | Status: AC
  Filled 2020-01-09: qty 10

## 2020-01-09 MED ORDER — MIDAZOLAM HCL 5 MG/5ML IJ SOLN
INTRAMUSCULAR | Status: DC | PRN
  Administered 2020-01-09 (×3): 2 mg via INTRAVENOUS

## 2020-01-09 MED ORDER — MIDAZOLAM HCL 5 MG/5ML IJ SOLN
INTRAMUSCULAR | Status: AC
  Filled 2020-01-09: qty 10

## 2020-01-09 MED ORDER — PROMETHAZINE HCL 25 MG/ML IJ SOLN
INTRAMUSCULAR | Status: AC
  Filled 2020-01-09: qty 1

## 2020-01-09 MED ORDER — SODIUM CHLORIDE 0.9 % IV SOLN
INTRAVENOUS | Status: DC
  Administered 2020-01-09: 1000 mL via INTRAVENOUS

## 2020-01-09 MED ORDER — LIDOCAINE VISCOUS HCL 2 % MT SOLN
OROMUCOSAL | Status: DC | PRN
  Administered 2020-01-09: 4 mL via OROMUCOSAL

## 2020-01-09 MED ORDER — MEPERIDINE HCL 50 MG/ML IJ SOLN
INTRAMUSCULAR | Status: DC | PRN
  Administered 2020-01-09 (×3): 25 mg via INTRAVENOUS

## 2020-01-09 MED ORDER — STERILE WATER FOR IRRIGATION IR SOLN
Status: DC | PRN
  Administered 2020-01-09: 1.5 mL

## 2020-01-09 MED ORDER — MEPERIDINE HCL 50 MG/ML IJ SOLN
INTRAMUSCULAR | Status: AC
  Filled 2020-01-09: qty 1

## 2020-01-09 MED ORDER — LIDOCAINE VISCOUS HCL 2 % MT SOLN
OROMUCOSAL | Status: AC
  Filled 2020-01-09: qty 15
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
## 2020-01-09 NOTE — Op Note (Signed)
[facility] Patient Name: David CellarRonald Anthony Procedure Date: 01/09/2020 1:36 PM MRN: [id number] Date of Birth: 02-07-1976 Attending MD: Lionel DecemberNajeeb Aidynn Polendo , MD CSN: [id number] Age: 44 Admit Type: Outpatient Procedure:                Upper GI endoscopy Indications:              Surveillance for malignancy due to personal history                            of Barrett's esophagus; Esophageal dysphagia, Providers:                Lionel DecemberNajeeb Manuella Blackson, MD, Loma MessingLurae B. Patsy LagerAlbert RN, RN, Edythe ClarityKelly                            Cox, Technician Referring MD:              Medicines:                Lidocaine jelly, Meperidine 75 mg IV, Midazolam 7                            mg IV, Promethazine 12.5 mg IV Complications:            No immediate complications. Estimated Blood Loss:     Estimated blood loss was minimal. Procedure:                Pre-Anesthesia Assessment:                           - Prior to the procedure, a History and Physical                            was performed, and patient medications and                            allergies were reviewed. The patient's tolerance of                            previous anesthesia was also reviewed. The risks                            and benefits of the procedure and the sedation                            options and risks were discussed with the patient.                            All questions were answered, and informed consent                            was obtained. Prior Anticoagulants: The patient has                            taken no previous anticoagulant or antiplatelet  agents except for NSAID medication. ASA Grade                            Assessment: I - A normal, healthy patient. After                            reviewing the risks and benefits, the patient was                            deemed in satisfactory condition to undergo the                            procedure.                           After obtaining informed  consent, the endoscope was                            passed under direct vision. Throughout the                            procedure, the patient's blood pressure, pulse, and                            oxygen saturations were monitored continuously. The                            GIF-H190 ([id number]) scope was introduced through the                            mouth, and advanced to the second part of duodenum.                            The upper GI endoscopy was accomplished without                            difficulty. The patient tolerated the procedure                            well. Scope In: 1:54:13 PM Scope Out: 2:05:15 PM Total Procedure Duration: 0 hours 11 minutes 2 seconds  Findings:      The hypopharynx was normal.      The proximal esophagus, mid esophagus and distal esophagus were normal.      There were esophageal mucosal changes consistent with short-segment       Barrett's esophagus present in the distal esophagus. The maximum       longitudinal extent of these mucosal changes was 0.5 cm in length.       Mucosa was biopsied with a cold forceps for histology randomly. One       specimen bottle was sent to pathology.      The Z-line was irregular and was found 35 cm from the incisors.      A 2 cm hiatal hernia was present.      No endoscopic abnormality was evident in the esophagus to explain the  patient's complaint of dysphagia.      Localized mild mucosal changes characterized by congestion and erythema       were found at herniated part of the stomach.. Biopsies were taken with a       cold forceps for histology. The pathology specimen was placed into       Bottle Number 2.      The entire examined stomach was normal.      The duodenal bulb and second portion of the duodenum were normal. Impression:               - Normal hypopharynx.                           - Normal proximal esophagus, mid esophagus and                            distal esophagus.                            - Esophageal mucosal changes consistent with                            short-segment Barrett's esophagus. Few small                            islands. Biopsied.                           - Z-line irregular, 35 cm from the incisors.                           - 2 cm hiatal hernia.                           - No endoscopic esophageal abnormality to explain                            patient's dysphagia. Esophagus not dilated.                           - Congested, erythematous mucosa of herniated part                            of the stomach.. Biopsied.                           - Normal stomach.                           - Normal duodenal bulb and second portion of the                            duodenum. Moderate Sedation:      Moderate (conscious) sedation was administered by the endoscopy nurse       and supervised by the endoscopist. The following parameters were       monitored: oxygen saturation, heart rate, blood pressure, CO2  capnography and response to care. Total physician intraservice time was       20 minutes. Recommendation:           - Patient has a contact number available for                            emergencies. The signs and symptoms of potential                            delayed complications were discussed with the                            patient. Return to normal activities tomorrow.                            Written discharge instructions were provided to the                            patient.                           - Resume previous diet today.                           - Continue present medications.                           - No aspirin, ibuprofen, naproxen, or other                            non-steroidal anti-inflammatory drugs for 1 day.                           - Await pathology results. Procedure Code(s):        --- Professional ---                           [phone], Esophagogastroduodenoscopy, flexible,                             transoral; with biopsy, single or multiple                           G0500, Moderate sedation services provided by the                            same physician or other qualified health care                            professional performing a gastrointestinal                            endoscopic service that sedation supports,                            requiring the presence of an independent trained  observer to assist in the monitoring of the                            patient's level of consciousness and physiological                            status; initial 15 minutes of intra-service time;                            patient age 52 years or older (additional time may                            be reported with 16109, as appropriate) Diagnosis Code(s):        --- Professional ---                           K22.70, Barrett's esophagus without dysplasia                           K22.8, Other specified diseases of esophagus                           K44.9, Diaphragmatic hernia without obstruction or                            gangrene                           R13.14, Dysphagia, pharyngoesophageal phase CPT copyright 2019 American Medical Association. All rights reserved. The codes documented in this report are preliminary and upon coder review may  be revised to meet current compliance requirements. Lionel December, MD Lionel December, MD 01/09/2020 2:20:53 PM This report has been signed electronically. Number of Addenda: 0

## 2020-01-09 NOTE — Discharge Instructions (Signed)
No aspirin or NSAIDs until 01/11/2020. Can take Tylenol 1 g tomorrow morning if you need to. Resume other medications and diet as before. No driving for 24 hours. Physician will call with biopsy results.   Upper Endoscopy, Adult, Care After This sheet gives you information about how to care for yourself after your procedure. Your health care provider may also give you more specific instructions. If you have problems or questions, contact your health care provider. What can I expect after the procedure? After the procedure, it is common to have:  A sore throat.  Mild stomach pain or discomfort.  Bloating.  Nausea. Follow these instructions at home:   Follow instructions from your health care provider about what to eat or drink after your procedure.  Return to your normal activities as told by your health care provider. Ask your health care provider what activities are safe for you.  Take over-the-counter and prescription medicines only as told by your health care provider.  Do not drive for 24 hours if you were given a sedative during your procedure.  Keep all follow-up visits as told by your health care provider. This is important. Contact a health care provider if you have:  A sore throat that lasts longer than one day.  Trouble swallowing. Get help right away if:  You vomit blood or your vomit looks like coffee grounds.  You have: ? A fever. ? Bloody, black, or tarry stools. ? A severe sore throat or you cannot swallow. ? Difficulty breathing. ? Severe pain in your chest or abdomen. Summary  After the procedure, it is common to have a sore throat, mild stomach discomfort, bloating, and nausea.  Do not drive for 24 hours if you were given a sedative during the procedure.  Follow instructions from your health care provider about what to eat or drink after your procedure.  Return to your normal activities as told by your health care provider. This information is  not intended to replace advice given to you by your health care provider. Make sure you discuss any questions you have with your health care provider. Document Revised: 02/14/2018 Document Reviewed: 01/23/2018 Elsevier Patient Education  2020 [employer]  PATIENT INSTRUCTIONS POST-ANESTHESIA  IMMEDIATELY FOLLOWING SURGERY:  Do not drive or operate machinery for the first twenty four hours after surgery.  Do not make any important decisions for twenty four hours after surgery or while taking narcotic pain medications or sedatives.  If you develop intractable nausea and vomiting or a severe headache please notify your doctor immediately.  FOLLOW-UP:  Please make an appointment with your surgeon as instructed. You do not need to follow up with anesthesia unless specifically instructed to do so.  WOUND CARE INSTRUCTIONS (if applicable):  Keep a dry clean dressing on the anesthesia/puncture wound site if there is drainage.  Once the wound has quit draining you may leave it open to air.  Generally you should leave the bandage intact for twenty four hours unless there is drainage.  If the epidural site drains for more than 36-48 hours please call the anesthesia department.  QUESTIONS?:  Please feel free to call your physician or the hospital operator if you have any questions, and they will be happy to assist you.      Barrett's Esophagus  Barrett's esophagus occurs when the tissue that lines the esophagus changes or becomes damaged. The esophagus is the tube that carries food from the throat to the stomach. With Barrett's esophagus, the cells that  line the esophagus are replaced by cells that are similar to the lining of the intestines (intestinal metaplasia). Barrett's esophagus itself may not cause any symptoms. However, many people who have Barrett's esophagus also have gastroesophageal reflux disease (GERD), which may cause symptoms such as heartburn. Over time, a few people with this condition  may develop cancer of the esophagus. Treatment may include medicines, procedures to destroy the abnormal cells, or surgery. What are the causes? The exact cause of this condition is not known. In some cases, the condition develops from damage to the lining of the esophagus caused by GERD. GERD occurs when stomach acids flow up from the stomach into the esophagus. Frequent symptoms of GERD may cause intestinal metaplasia or cause cell changes (dysplasia). What increases the risk? You are more likely to develop this condition if you:  Have GERD.  Are male.  Are Caucasian.  Are obese.  Are older than 50.  Have a hiatal hernia. This is a condition in which part of your stomach bulges into your chest.  Smoke. What are the signs or symptoms? People with Barrett's esophagus often have no symptoms. However, many people with this condition also have GERD. Symptoms of GERD may include:  Heartburn.  Difficulty swallowing.  Dry cough. How is this diagnosed? This condition may be diagnosed based on:  Results of an upper gastrointestinal endoscopy. For this exam, a thin, flexible tube with a light and a camera on the end (endoscope) is passed down your esophagus. Your health care provider can view the inside of your esophagus during this procedure.  Results of a biopsy. For this procedure, several tissue samples are removed (biopsy) from your esophagus. They are then checked for intestinal metaplasia or dysplasia. How is this treated? Treatment for this condition may include:  Medicines (proton pump inhibitors, or PPIs) to decrease or stop GERD.  Periodic endoscopic exams to make sure that cancer is not developing.  A procedure or surgery for dysplasia. This may include: ? Removal or destruction of abnormal cells. ? Removal of part of the esophagus (esophagectomy). Follow these instructions at home: Eating and drinking  Eat more fruits and vegetables.  Avoid fatty foods.  Eat  small, frequent meals instead of large meals.  Avoid foods that cause heartburn. These foods include: ? Coffee and alcoholic drinks. ? Tomatoes and foods made with tomatoes. ? Greasy or spicy foods. ? Chocolate and peppermint.  Do not drink alcohol. General instructions  Take over-the-counter and prescription medicines only as told by your health care provider.  Do not use any products that contain nicotine or tobacco, such as cigarettes and e-cigarettes. If you need help quitting, ask your health care provider.  If you are being treated for GERD, make sure you take medicines and follow all instructions as told by your health care provider.  Keep all follow-up visits as told by your health care provider. This is important. Contact a health care provider if:  You have heartburn or GERD symptoms.  You have difficulty swallowing. Get help right away if:  You have chest pain.  You are unable to swallow.  You vomit blood or material that looks like coffee grounds.  Your stool (feces) is bright red or dark. Summary  Barrett's esophagus occurs when the tissue that lines the esophagus changes or becomes damaged.  Barrett's esophagus may be diagnosed with an upper gastrointestinal endoscopy and a biopsy.  Treatment may include medicines, procedures to remove abnormal cells, or surgery.  Follow your health  care provider's instructions about what to eat and drink, what medicines to take, and when to call for help. This information is not intended to replace advice given to you by your health care provider. Make sure you discuss any questions you have with your health care provider. Document Revised: 12/19/2017 Document Reviewed: 12/19/2017 Elsevier Patient Education  2020 [employer]   Hiatal Hernia  A hiatal hernia occurs when part of the stomach slides above the muscle that separates the abdomen from the chest (diaphragm). A person can be born with a hiatal hernia  (congenital), or it may develop over time. In almost all cases of hiatal hernia, only the top part of the stomach pushes through the diaphragm. Many people have a hiatal hernia with no symptoms. The larger the hernia, the more likely it is that you will have symptoms. In some cases, a hiatal hernia allows stomach acid to flow back into the tube that carries food from your mouth to your stomach (esophagus). This may cause heartburn symptoms. Severe heartburn symptoms may mean that you have developed a condition called gastroesophageal reflux disease (GERD). What are the causes? This condition is caused by a weakness in the opening (hiatus) where the esophagus passes through the diaphragm to attach to the upper part of the stomach. A person may be born with a weakness in the hiatus, or a weakness can develop over time. What increases the risk? This condition is more likely to develop in:  Older people. Age is a major risk factor for a hiatal hernia, especially if you are over the age of 3.  Pregnant women.  People who are overweight.  People who have frequent constipation. What are the signs or symptoms? Symptoms of this condition usually develop in the form of GERD symptoms. Symptoms include:  Heartburn.  Belching.  Indigestion.  Trouble swallowing.  Coughing or wheezing.  Sore throat.  Hoarseness.  Chest pain.  Nausea and vomiting. How is this diagnosed? This condition may be diagnosed during testing for GERD. Tests that may be done include:  X-rays of your stomach or chest.  An upper gastrointestinal (GI) series. This is an X-ray exam of your GI tract that is taken after you swallow a chalky liquid that shows up clearly on the X-ray.  Endoscopy. This is a procedure to look into your stomach using a thin, flexible tube that has a tiny camera and light on the end of it. How is this treated? This condition may be treated by:  Dietary and lifestyle changes to help reduce  GERD symptoms.  Medicines. These may include: ? Over-the-counter antacids. ? Medicines that make your stomach empty more quickly. ? Medicines that block the production of stomach acid (H2 blockers). ? Stronger medicines to reduce stomach acid (proton pump inhibitors).  Surgery to repair the hernia, if other treatments are not helping. If you have no symptoms, you may not need treatment. Follow these instructions at home: Lifestyle and activity  Do not use any products that contain nicotine or tobacco, such as cigarettes and e-cigarettes. If you need help quitting, ask your health care provider.  Try to achieve and maintain a healthy body weight.  Avoid putting pressure on your abdomen. Anything that puts pressure on your abdomen increases the amount of acid that may be pushed up into your esophagus. ? Avoid bending over, especially after eating. ? Raise the head of your bed by putting blocks under the legs. This keeps your head and esophagus higher than your stomach. ?  Do not wear tight clothing around your chest or stomach. ? Try not to strain when having a bowel movement, when urinating, or when lifting heavy objects. Eating and drinking  Avoid foods that can worsen GERD symptoms. These may include: ? Fatty foods, like fried foods. ? Citrus fruits, like oranges or lemon. ? Other foods and drinks that contain acid, like orange juice or tomatoes. ? Spicy food. ? Chocolate.  Eat frequent small meals instead of three large meals a day. This helps prevent your stomach from getting too full. ? Eat slowly. ? Do not lie down right after eating. ? Do not eat 1-2 hours before bed.  Do not drink beverages with caffeine. These include cola, coffee, cocoa, and tea.  Do not drink alcohol. General instructions  Take over-the-counter and prescription medicines only as told by your health care provider.  Keep all follow-up visits as told by your health care provider. This is  important. Contact a health care provider if:  Your symptoms are not controlled with medicines or lifestyle changes.  You are having trouble swallowing.  You have coughing or wheezing that will not go away. Get help right away if:  Your pain is getting worse.  Your pain spreads to your arms, neck, jaw, teeth, or back.  You have shortness of breath.  You sweat for no reason.  You feel sick to your stomach (nauseous) or you vomit.  You vomit blood.  You have bright red blood in your stools.  You have black, tarry stools. This information is not intended to replace advice given to you by your health care provider. Make sure you discuss any questions you have with your health care provider. Document Revised: 08/05/2017 Document Reviewed: 03/28/2017 Elsevier Patient Education  2020 [employer].

## 2020-01-14 ENCOUNTER — Other Ambulatory Visit: Payer: Self-pay

## 2020-01-14 LAB — SURGICAL PATHOLOGY

## 2020-01-16 ENCOUNTER — Ambulatory Visit (INDEPENDENT_AMBULATORY_CARE_PROVIDER_SITE_OTHER): Payer: BLUE CROSS/BLUE SHIELD | Admitting: Gastroenterology

## 2020-02-28 ENCOUNTER — Encounter (INDEPENDENT_AMBULATORY_CARE_PROVIDER_SITE_OTHER): Payer: Self-pay | Admitting: *Deleted

## 2020-02-28 ENCOUNTER — Ambulatory Visit (INDEPENDENT_AMBULATORY_CARE_PROVIDER_SITE_OTHER): Payer: BLUE CROSS/BLUE SHIELD | Admitting: Gastroenterology

## 2020-02-28 ENCOUNTER — Other Ambulatory Visit: Payer: Self-pay

## 2020-02-28 ENCOUNTER — Encounter (INDEPENDENT_AMBULATORY_CARE_PROVIDER_SITE_OTHER): Payer: Self-pay | Admitting: Gastroenterology

## 2020-02-28 VITALS — BP 126/77 | HR 67 | Temp 98.9°F | Ht 69.0 in | Wt 232.1 lb

## 2020-02-28 DIAGNOSIS — K7400 Hepatic fibrosis, unspecified: Secondary | ICD-10-CM

## 2020-02-28 DIAGNOSIS — Z8619 Personal history of other infectious and parasitic diseases: Secondary | ICD-10-CM

## 2020-02-28 NOTE — Patient Instructions (Signed)
Labs and ultrasound w/ fibrosis scoring   GERD instructions: -Please avoid lying flat within 2 to 3 hours of eating, this will make reflux symptoms worse. -Some patients find elevating the head of the bed beneficial. -Avoid spicy greasy foods as well as caffeine, coffee, sodas-these food/drinks can worsen heartburn and reflux. -Tobacco will worsen reflux, please try to decrease/eliminate tobacco intake if applicable. -Avoid NSAID products (ibuprofen, aspirin, Advil, Aleve, Goody's, BCs, Alka-Seltzer) - if needing these occasionally please try to take with meal or snack to decrease stomach irritation. -If taking medication for reflux such as prilosec, nexium, aciphex, dexilant, prevacid - take 20-30 minutes before a meal for maximum effectiveness.
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
## 2020-02-29 LAB — CBC WITH DIFFERENTIAL/PLATELET
Absolute Monocytes: 582 cells/uL (ref 200–950)
Basophils Absolute: 49 cells/uL (ref 0–200)
Basophils Relative: 0.6 %
Eosinophils Absolute: 189 cells/uL (ref 15–500)
Eosinophils Relative: 2.3 %
HCT: 44.2 % (ref 38.5–50.0)
Hemoglobin: 15.1 g/dL (ref 13.2–17.1)
Lymphs Abs: 1968 cells/uL (ref 850–3900)
MCH: 31.1 pg (ref 27.0–33.0)
MCHC: 34.2 g/dL (ref 32.0–36.0)
MCV: 91.1 fL (ref 80.0–100.0)
MPV: 10.9 fL (ref 7.5–12.5)
Monocytes Relative: 7.1 %
Neutro Abs: 5412 cells/uL (ref 1500–7800)
Neutrophils Relative %: 66 %
Platelets: 284 10*3/uL (ref 140–400)
RBC: 4.85 10*6/uL (ref 4.20–5.80)
RDW: 11.6 % (ref 11.0–15.0)
Total Lymphocyte: 24 %
WBC: 8.2 10*3/uL (ref 3.8–10.8)

## 2020-02-29 LAB — COMPLETE METABOLIC PANEL WITH GFR
AG Ratio: 1.7 (calc) (ref 1.0–2.5)
ALT: 21 U/L (ref 9–46)
AST: 15 U/L (ref 10–40)
Albumin: 4.5 g/dL (ref 3.6–5.1)
Alkaline phosphatase (APISO): 50 U/L (ref 36–130)
BUN: 18 mg/dL (ref 7–25)
CO2: 28 mmol/L (ref 20–32)
Calcium: 9.5 mg/dL (ref 8.6–10.3)
Chloride: 104 mmol/L (ref 98–110)
Creat: 1 mg/dL (ref 0.60–1.35)
GFR, Est African American: 106 mL/min/{1.73_m2} (ref >=60)
GFR, Est Non African American: 92 mL/min/{1.73_m2} (ref >=60)
Globulin: 2.6 g/dL (calc) (ref 1.9–3.7)
Glucose, Bld: 96 mg/dL (ref 65–139)
Potassium: 4.3 mmol/L (ref 3.5–5.3)
Sodium: 138 mmol/L (ref 135–146)
Total Bilirubin: 0.6 mg/dL (ref 0.2–1.2)
Total Protein: 7.1 g/dL (ref 6.1–8.1)

## 2020-02-29 LAB — PROTIME-INR
INR: 1
Prothrombin Time: 10.9 s (ref 9.0–11.5)

## 2020-03-06 ENCOUNTER — Ambulatory Visit (HOSPITAL_COMMUNITY)
Admission: RE | Admit: 2020-03-06 | Discharge: 2020-03-06 | Disposition: A | Discharge status: Home / Self Care | Payer: BLUE CROSS/BLUE SHIELD | Source: Ambulatory Visit | Attending: Gastroenterology | Admitting: Gastroenterology

## 2020-03-06 ENCOUNTER — Other Ambulatory Visit: Payer: Self-pay

## 2020-03-06 DIAGNOSIS — K7400 Hepatic fibrosis, unspecified: Secondary | ICD-10-CM | POA: Insufficient documentation

## 2020-03-06 DIAGNOSIS — Z8619 Personal history of other infectious and parasitic diseases: Secondary | ICD-10-CM

## 2020-09-18 IMAGING — US US ABDOMEN LIMITED W/ ELASTOGRAPHY
1 series · 12 of 25 positions shown · non-contrast
Comparison: 07/14/2018

CLINICAL DATA: Chronic hepatitis-C.

EXAM:
US ABDOMEN LIMITED - RIGHT UPPER QUADRANT
ULTRASOUND HEPATIC ELASTOGRAPHY
TECHNIQUE: Sonography of the right upper quadrant was performed. In addition,
ultrasound elastography evaluation of the liver was performed. A
region of interest was placed within the right lobe of the liver.
Following application of a compressive sonographic pulse, tissue
compressibility was assessed. Multiple assessments were performed at
the selected site. Median tissue compressibility was determined.
Previously, hepatic stiffness was assessed by shear wave velocity.
Based on recently published Society of Radiologists in Ultrasound
consensus article, reporting is now recommended to be performed in
the SI units of pressure (kiloPascals) representing hepatic
stiffness/elasticity. The obtained result is compared to the
published reference standards. (cACLD = compensated Advanced Chronic
Liver Disease)

[Series 1: us abdomen limited w/ elastography · 12 of 41 slices shown]
[im 2/41]
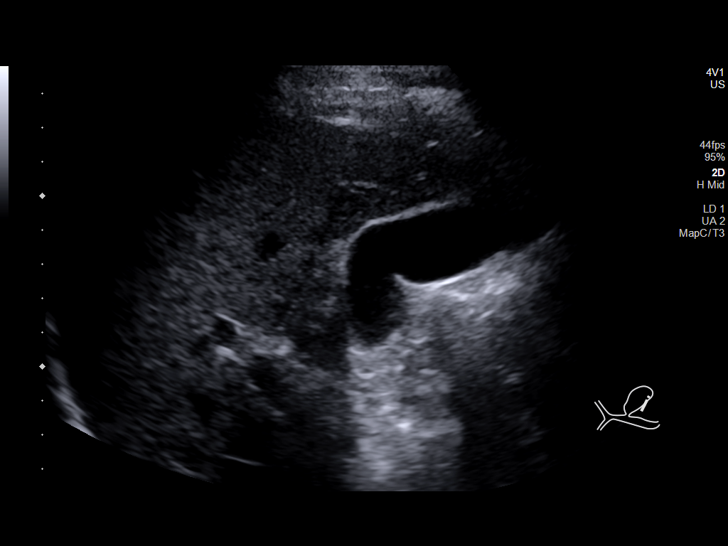
[im 6/41]
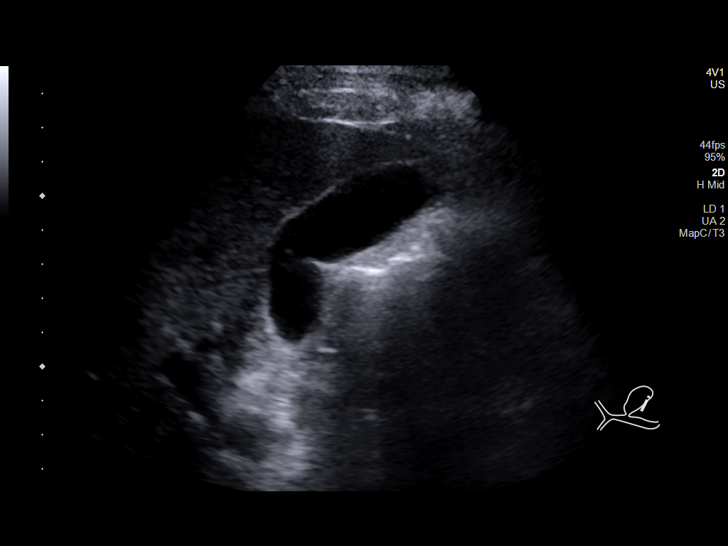
[im 9/41]
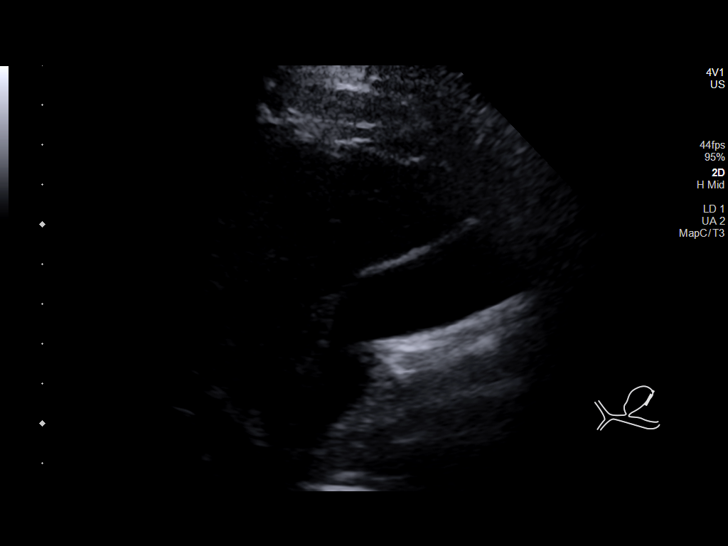
[im 12/41]
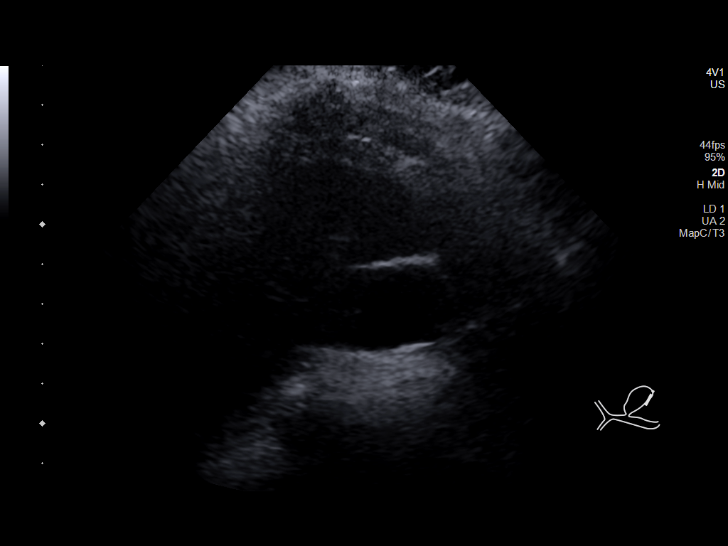
[im 16/41]
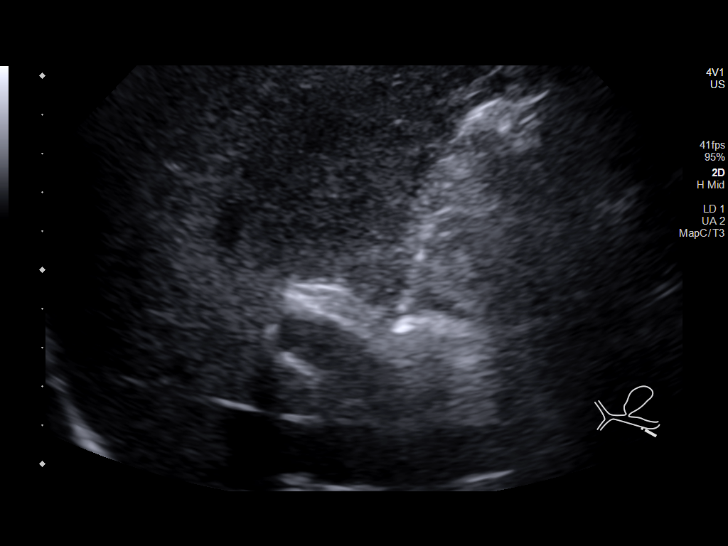
[im 19/41]
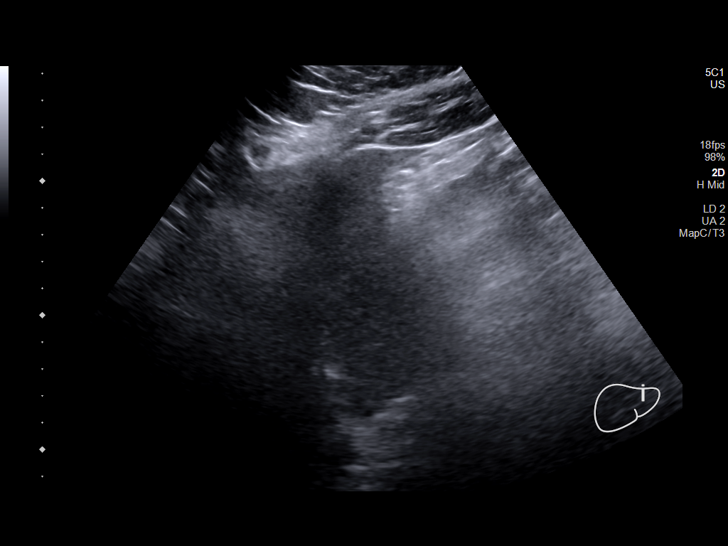
[im 22/41]
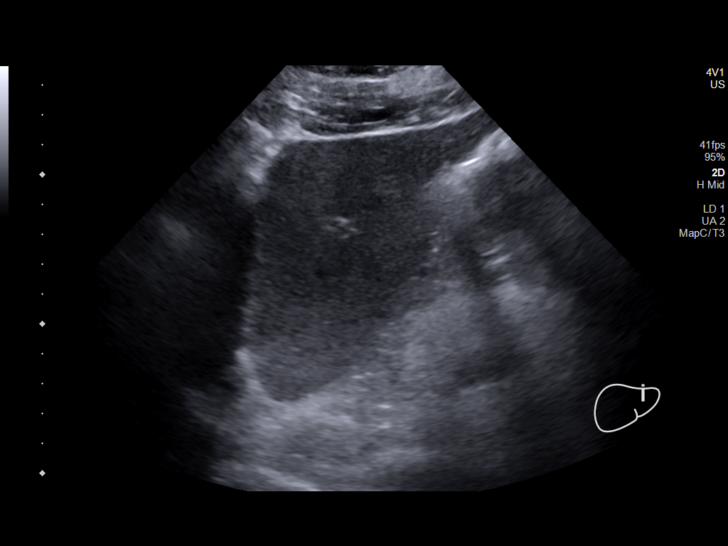
[im 26/41]
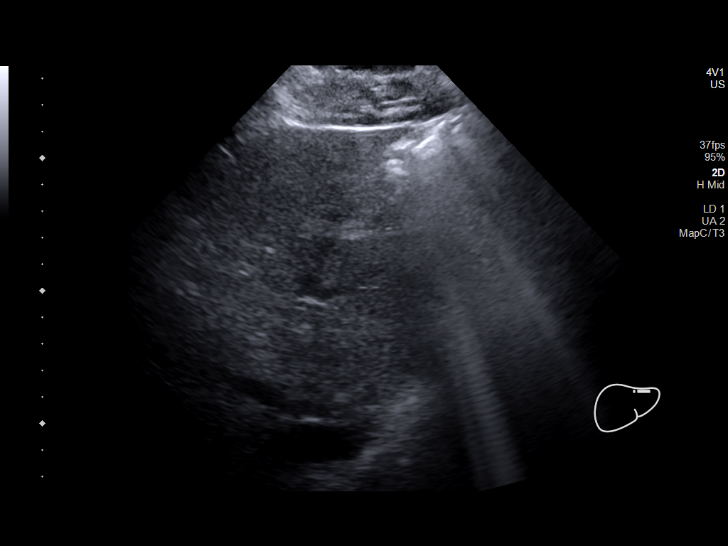
[im 29/41]
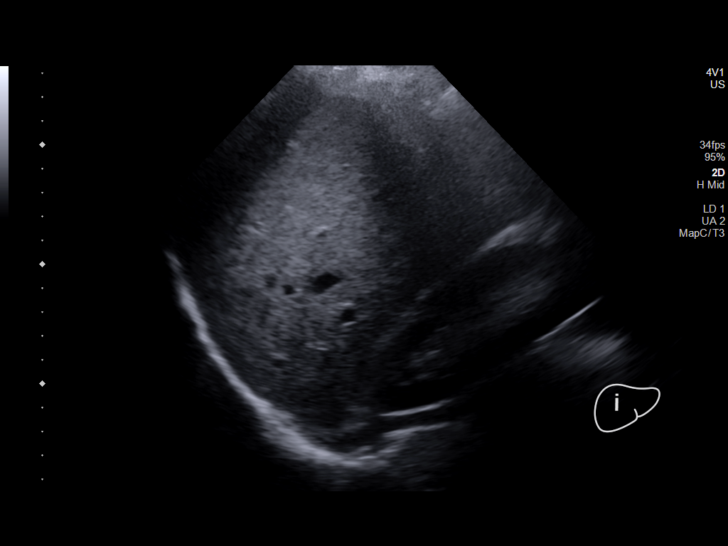
[im 32/41]
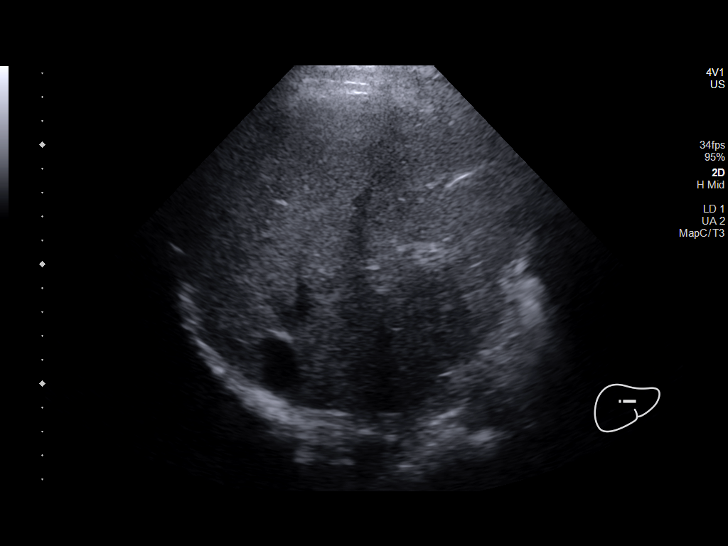
[im 36/41]
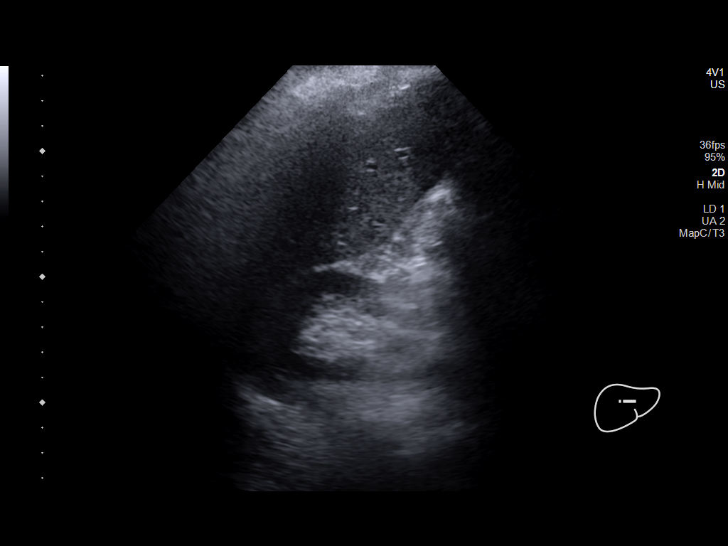
[im 39/41]
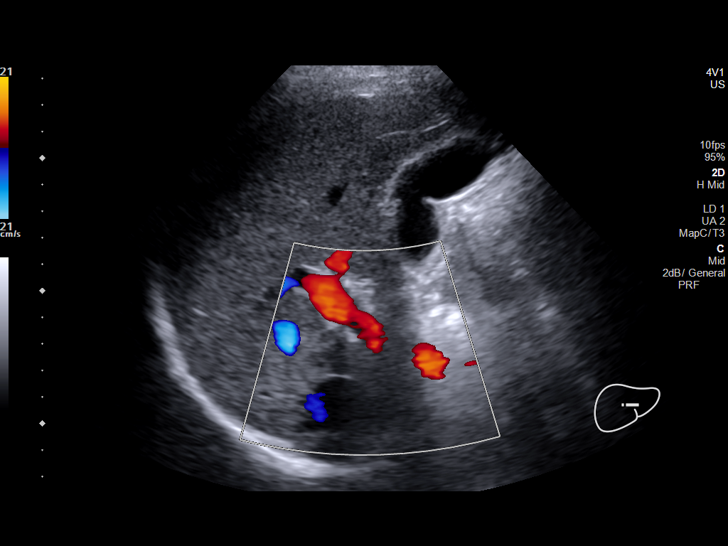

[12 of 25 positions shown; findings below may reference images not displayed]

FINDINGS: ULTRASOUND ABDOMEN LIMITED RIGHT UPPER QUADRANT

Gallbladder:

No gallstones or wall thickening visualized. No sonographic Murphy
sign noted.

Common bile duct:

Diameter: 3 mm, within normal limits.

Liver:

No focal lesion identified. Within normal limits in parenchymal
echogenicity. Portal vein is patent on color Doppler imaging with
normal direction of blood flow towards the liver.

ULTRASOUND HEPATIC ELASTOGRAPHY

Device: Siemens Helix VTQ

Patient position: Supine

Transducer 5C1

Number of measurements: 10

Hepatic segment:  8

Median kPa:

IQR:

IQR/Median kPa ratio:

Data quality:  Good

Diagnostic category: < or = 9 kPa: in the absence of other known
clinical signs, rules out cACLD
IMPRESSION: ULTRASOUND RUQ:

Negative.  No hepatobiliary abnormality identified.

ULTRASOUND HEPATIC ELASTOGRAPHY:

Median kPa:

Diagnostic category: < or = 9 kPa: in the absence of other known
clinical signs, rules out cACLD

The use of hepatic elastography is applicable to patients with viral
hepatitis and non-alcoholic fatty liver disease. At this time, there
is insufficient data for the referenced cut-off values and use in
other causes of liver disease, including alcoholic liver disease.
Patients, however, may be assessed by elastography and serve as
their own reference standard/baseline.

In patients with non-alcoholic liver disease, the values suggesting
compensated advanced chronic liver disease (cACLD) may be lower, and
patients may need additional testing with elasticity results of [DATE]
kPa.

Please note that abnormal hepatic elasticity and shear wave
velocities may also be identified in clinical settings other than
with hepatic fibrosis, such as: acute hepatitis, elevated right
heart and central venous pressures including use of beta blockers,
[place] disease ([place]), infiltrative processes such as
mastocytosis/amyloidosis/infiltrative tumor/lymphoma, extrahepatic
cholestasis, with hyperemia in the post-prandial state, and with
liver transplantation. Correlation with patient history, laboratory
data, and clinical condition recommended.

Diagnostic Categories:

< or =5 kPa: high probability of being normal

< or =9 kPa: in the absence of other known clinical signs, rules [DATE] kPa and ?13 kPa: suggestive of cACLD, but needs further testing

>13 kPa: highly suggestive of cACLD

> or =17 kPa: highly suggestive of cACLD with an increased
probability of clinically significant portal hypertension

## 2022-07-19 ENCOUNTER — Encounter (INDEPENDENT_AMBULATORY_CARE_PROVIDER_SITE_OTHER): Payer: Self-pay | Admitting: Gastroenterology
